# Patient Record
Sex: Male | Born: 1970 | Race: White | Hispanic: No | Smoking: Former smoker
Health system: Southern US, Community
[De-identification: ages and names within clinical notes are randomized; demographics above are authoritative.]

## PROBLEM LIST (undated history)

## (undated) DIAGNOSIS — Z9889 Other specified postprocedural states: Secondary | ICD-10-CM

## (undated) DIAGNOSIS — N200 Calculus of kidney: Secondary | ICD-10-CM

## (undated) DIAGNOSIS — M5136 Other intervertebral disc degeneration, lumbar region: Secondary | ICD-10-CM

## (undated) DIAGNOSIS — M5417 Radiculopathy, lumbosacral region: Secondary | ICD-10-CM

## (undated) DIAGNOSIS — M5410 Radiculopathy, site unspecified: Secondary | ICD-10-CM

## (undated) DIAGNOSIS — I10 Essential (primary) hypertension: Secondary | ICD-10-CM

## (undated) DIAGNOSIS — M545 Low back pain: Secondary | ICD-10-CM

## (undated) HISTORY — PX: CHOLECYSTECTOMY: SHX55

## (undated) HISTORY — DX: Radiculopathy, lumbosacral region: M54.17

## (undated) HISTORY — DX: Low back pain: M54.5

## (undated) HISTORY — DX: Other intervertebral disc degeneration, lumbar region: M51.36

## (undated) HISTORY — DX: Other specified postprocedural states: Z98.890

## (undated) HISTORY — PX: KNEE SURGERY: SHX244

## (undated) HISTORY — PX: ABDOMINAL EXPLORATION SURGERY: SHX538

## (undated) HISTORY — DX: Radiculopathy, site unspecified: M54.10

---

## 2006-04-26 ENCOUNTER — Emergency Department: Payer: Self-pay

## 2007-01-04 ENCOUNTER — Inpatient Hospital Stay: Payer: Self-pay | Admitting: General Surgery

## 2007-01-04 ENCOUNTER — Other Ambulatory Visit: Payer: Self-pay

## 2007-08-11 ENCOUNTER — Encounter: Admission: RE | Admit: 2007-08-11 | Discharge: 2007-08-11 | Payer: Self-pay | Admitting: Urology

## 2009-10-18 ENCOUNTER — Ambulatory Visit: Payer: Self-pay | Admitting: Otolaryngology

## 2011-04-24 ENCOUNTER — Ambulatory Visit: Payer: Self-pay | Admitting: Internal Medicine

## 2011-04-29 ENCOUNTER — Ambulatory Visit: Payer: Self-pay | Admitting: Internal Medicine

## 2011-05-04 ENCOUNTER — Ambulatory Visit: Payer: Self-pay | Admitting: Internal Medicine

## 2011-06-04 ENCOUNTER — Ambulatory Visit: Payer: Self-pay | Admitting: Internal Medicine

## 2011-06-04 ENCOUNTER — Ambulatory Visit: Payer: Self-pay | Admitting: Gastroenterology

## 2012-06-09 ENCOUNTER — Ambulatory Visit: Payer: Self-pay | Admitting: Internal Medicine

## 2012-06-13 ENCOUNTER — Ambulatory Visit: Payer: Self-pay | Admitting: Internal Medicine

## 2012-06-13 LAB — CBC CANCER CENTER
Basophil %: 1 %
Eosinophil #: 0.5 x10 3/mm (ref 0.0–0.7)
Eosinophil %: 4.2 %
HGB: 18 g/dL (ref 13.0–18.0)
Lymphocyte %: 26.5 %
Monocyte %: 8 %
Neutrophil %: 60.3 %
RBC: 6.65 10*6/uL — ABNORMAL HIGH (ref 4.40–5.90)
WBC: 12.9 x10 3/mm — ABNORMAL HIGH (ref 3.8–10.6)

## 2012-06-17 LAB — CANCER CENTER HEMATOCRIT: HCT: 56.1 % — ABNORMAL HIGH (ref 40.0–52.0)

## 2012-06-20 LAB — CANCER CENTER HEMATOCRIT: HCT: 54.1 % — ABNORMAL HIGH (ref 40.0–52.0)

## 2012-06-28 LAB — CANCER CENTER HEMATOCRIT: HCT: 49.9 % (ref 40.0–52.0)

## 2012-07-03 ENCOUNTER — Ambulatory Visit: Payer: Self-pay | Admitting: Internal Medicine

## 2012-07-19 LAB — CBC CANCER CENTER
Basophil %: 1.5 %
Eosinophil #: 0.4 x10 3/mm (ref 0.0–0.7)
Lymphocyte #: 3.6 x10 3/mm (ref 1.0–3.6)
MCH: 25.6 pg — ABNORMAL LOW (ref 26.0–34.0)
MCHC: 31.6 g/dL — ABNORMAL LOW (ref 32.0–36.0)
Monocyte #: 1.5 x10 3/mm — ABNORMAL HIGH (ref 0.2–1.0)
Neutrophil #: 10.4 x10 3/mm — ABNORMAL HIGH (ref 1.4–6.5)
RBC: 6.13 10*6/uL — ABNORMAL HIGH (ref 4.40–5.90)
RDW: 15.1 % — ABNORMAL HIGH (ref 11.5–14.5)
WBC: 16.2 x10 3/mm — ABNORMAL HIGH (ref 3.8–10.6)

## 2012-07-22 ENCOUNTER — Ambulatory Visit: Payer: Self-pay | Admitting: Internal Medicine

## 2012-08-02 LAB — CANCER CENTER HEMATOCRIT: HCT: 47.5 % (ref 40.0–52.0)

## 2012-08-03 ENCOUNTER — Ambulatory Visit: Payer: Self-pay | Admitting: Internal Medicine

## 2012-08-23 LAB — CBC CANCER CENTER
Basophil #: 0.1 x10 3/mm (ref 0.0–0.1)
Basophil %: 1.1 %
Eosinophil %: 1.6 %
HGB: 14.8 g/dL (ref 13.0–18.0)
MCV: 78 fL — ABNORMAL LOW (ref 80–100)
Neutrophil #: 8.2 x10 3/mm — ABNORMAL HIGH (ref 1.4–6.5)
Neutrophil %: 68 %
Platelet: 256 x10 3/mm (ref 150–440)
RBC: 6.09 10*6/uL — ABNORMAL HIGH (ref 4.40–5.90)

## 2012-09-03 ENCOUNTER — Ambulatory Visit: Payer: Self-pay | Admitting: Internal Medicine

## 2012-09-13 LAB — CANCER CENTER HEMATOCRIT: HCT: 50.2 % (ref 40.0–52.0)

## 2012-10-01 ENCOUNTER — Ambulatory Visit: Payer: Self-pay | Admitting: Internal Medicine

## 2012-10-11 LAB — OCCULT BLOOD X 1 CARD TO LAB, STOOL: Occult Blood, Feces: NEGATIVE

## 2012-10-20 ENCOUNTER — Emergency Department: Payer: Self-pay | Admitting: Emergency Medicine

## 2012-10-20 LAB — BASIC METABOLIC PANEL
BUN: 10 mg/dL (ref 7–18)
Calcium, Total: 8.7 mg/dL (ref 8.5–10.1)
Chloride: 100 mmol/L (ref 98–107)
Creatinine: 0.64 mg/dL (ref 0.60–1.30)
EGFR (African American): >60
Glucose: 79 mg/dL (ref 65–99)
Osmolality: 272 (ref 275–301)

## 2012-10-20 LAB — CBC
HGB: 15.8 g/dL (ref 13.0–18.0)
MCH: 23.9 pg — ABNORMAL LOW (ref 26.0–34.0)

## 2012-10-20 LAB — URINALYSIS, COMPLETE
Bilirubin,UR: NEGATIVE
Glucose,UR: NEGATIVE mg/dL (ref 0–75)
Nitrite: NEGATIVE
Ph: 5 (ref 4.5–8.0)
Specific Gravity: 1.026 (ref 1.003–1.030)
WBC UR: 70 /HPF (ref 0–5)

## 2012-11-01 ENCOUNTER — Ambulatory Visit: Payer: Self-pay | Admitting: Internal Medicine

## 2012-12-01 ENCOUNTER — Ambulatory Visit: Payer: Self-pay | Admitting: Internal Medicine

## 2014-02-25 ENCOUNTER — Emergency Department: Payer: Self-pay | Admitting: Internal Medicine

## 2014-02-25 LAB — URINALYSIS, COMPLETE
Bilirubin,UR: NEGATIVE
Blood: NEGATIVE
GLUCOSE, UR: NEGATIVE mg/dL (ref 0–75)
KETONE: NEGATIVE
LEUKOCYTE ESTERASE: NEGATIVE
Nitrite: NEGATIVE
Ph: 7 (ref 4.5–8.0)
Protein: 30
RBC,UR: 1 /HPF (ref 0–5)
SPECIFIC GRAVITY: 1.028 (ref 1.003–1.030)
Squamous Epithelial: 2
WBC UR: 7 /HPF (ref 0–5)

## 2014-02-25 LAB — BASIC METABOLIC PANEL
Anion Gap: 9 (ref 7–16)
BUN: 18 mg/dL (ref 7–18)
Calcium, Total: 8.4 mg/dL — ABNORMAL LOW (ref 8.5–10.1)
Chloride: 100 mmol/L (ref 98–107)
Co2: 30 mmol/L (ref 21–32)
Creatinine: 0.78 mg/dL (ref 0.60–1.30)
EGFR (African American): >60
Glucose: 93 mg/dL (ref 65–99)
OSMOLALITY: 279 (ref 275–301)
Potassium: 3.4 mmol/L — ABNORMAL LOW (ref 3.5–5.1)
Sodium: 139 mmol/L (ref 136–145)

## 2014-02-25 LAB — CBC
HCT: 48.1 % (ref 40.0–52.0)
HGB: 16.1 g/dL (ref 13.0–18.0)
MCH: 29.1 pg (ref 26.0–34.0)
MCHC: 33.4 g/dL (ref 32.0–36.0)
MCV: 87 fL (ref 80–100)
Platelet: 271 10*3/uL (ref 150–440)
RBC: 5.52 10*6/uL (ref 4.40–5.90)
RDW: 13.3 % (ref 11.5–14.5)
WBC: 15.4 10*3/uL — ABNORMAL HIGH (ref 3.8–10.6)

## 2014-02-25 LAB — TROPONIN I: Troponin-I: <0.02 ng/mL

## 2014-05-18 ENCOUNTER — Emergency Department: Payer: Self-pay | Admitting: Emergency Medicine

## 2014-05-18 LAB — URINALYSIS, COMPLETE
Bacteria: NONE SEEN
Bilirubin,UR: NEGATIVE
GLUCOSE, UR: NEGATIVE mg/dL (ref 0–75)
Ketone: NEGATIVE
Leukocyte Esterase: NEGATIVE
NITRITE: NEGATIVE
PH: 6 (ref 4.5–8.0)
Protein: NEGATIVE
Specific Gravity: 1.019 (ref 1.003–1.030)
WBC UR: 5 /HPF (ref 0–5)

## 2014-05-18 LAB — COMPREHENSIVE METABOLIC PANEL
ALBUMIN: 3.7 g/dL (ref 3.4–5.0)
ALK PHOS: 53 U/L
ALT: 31 U/L
AST: 14 U/L — AB (ref 15–37)
Anion Gap: 9 (ref 7–16)
BUN: 10 mg/dL (ref 7–18)
Bilirubin,Total: 0.3 mg/dL (ref 0.2–1.0)
CHLORIDE: 99 mmol/L (ref 98–107)
CO2: 32 mmol/L (ref 21–32)
CREATININE: 0.74 mg/dL (ref 0.60–1.30)
Calcium, Total: 8.9 mg/dL (ref 8.5–10.1)
Glucose: 85 mg/dL (ref 65–99)
OSMOLALITY: 278 (ref 275–301)
POTASSIUM: 3.4 mmol/L — AB (ref 3.5–5.1)
SODIUM: 140 mmol/L (ref 136–145)
Total Protein: 7.9 g/dL (ref 6.4–8.2)

## 2014-05-18 LAB — CBC
HCT: 49.6 % (ref 40.0–52.0)
HGB: 16.1 g/dL (ref 13.0–18.0)
MCH: 28.7 pg (ref 26.0–34.0)
MCHC: 32.5 g/dL (ref 32.0–36.0)
MCV: 89 fL (ref 80–100)
Platelet: 307 10*3/uL (ref 150–440)
RBC: 5.6 10*6/uL (ref 4.40–5.90)
RDW: 13.2 % (ref 11.5–14.5)
WBC: 17.8 10*3/uL — ABNORMAL HIGH (ref 3.8–10.6)

## 2014-05-23 ENCOUNTER — Emergency Department: Payer: Self-pay | Admitting: Emergency Medicine

## 2014-05-23 LAB — CBC
HCT: 52.7 % — AB (ref 40.0–52.0)
HGB: 16.8 g/dL (ref 13.0–18.0)
MCH: 28.5 pg (ref 26.0–34.0)
MCHC: 32 g/dL (ref 32.0–36.0)
MCV: 89 fL (ref 80–100)
Platelet: 308 10*3/uL (ref 150–440)
RBC: 5.9 10*6/uL (ref 4.40–5.90)
RDW: 13.5 % (ref 11.5–14.5)
WBC: 9.5 10*3/uL (ref 3.8–10.6)

## 2014-05-23 LAB — COMPREHENSIVE METABOLIC PANEL
ALK PHOS: 57 U/L
AST: 18 U/L (ref 15–37)
Albumin: 3.8 g/dL (ref 3.4–5.0)
Anion Gap: 7 (ref 7–16)
BUN: 13 mg/dL (ref 7–18)
Bilirubin,Total: 0.3 mg/dL (ref 0.2–1.0)
CALCIUM: 8.9 mg/dL (ref 8.5–10.1)
Chloride: 101 mmol/L (ref 98–107)
Co2: 33 mmol/L — ABNORMAL HIGH (ref 21–32)
Creatinine: 0.74 mg/dL (ref 0.60–1.30)
GLUCOSE: 80 mg/dL (ref 65–99)
Osmolality: 280 (ref 275–301)
Potassium: 3.6 mmol/L (ref 3.5–5.1)
SGPT (ALT): 27 U/L
SODIUM: 141 mmol/L (ref 136–145)
TOTAL PROTEIN: 8.4 g/dL — AB (ref 6.4–8.2)

## 2014-05-23 LAB — URINALYSIS, COMPLETE
BACTERIA: NONE SEEN
BILIRUBIN, UR: NEGATIVE
GLUCOSE, UR: NEGATIVE mg/dL (ref 0–75)
Ketone: NEGATIVE
Nitrite: NEGATIVE
Ph: 6 (ref 4.5–8.0)
RBC,UR: 2146 /HPF (ref 0–5)
SPECIFIC GRAVITY: 1.027 (ref 1.003–1.030)
Squamous Epithelial: 5

## 2014-05-31 ENCOUNTER — Emergency Department: Payer: Self-pay | Admitting: Emergency Medicine

## 2014-05-31 LAB — URINALYSIS, COMPLETE
BILIRUBIN, UR: NEGATIVE
Bacteria: NONE SEEN
Blood: NEGATIVE
GLUCOSE, UR: NEGATIVE mg/dL (ref 0–75)
Ketone: NEGATIVE
Nitrite: NEGATIVE
Ph: 6 (ref 4.5–8.0)
RBC,UR: 1 /HPF (ref 0–5)
Specific Gravity: 1.024 (ref 1.003–1.030)
WBC UR: 23 /HPF (ref 0–5)

## 2014-06-11 ENCOUNTER — Ambulatory Visit: Payer: Self-pay | Admitting: Urology

## 2014-10-27 ENCOUNTER — Emergency Department: Payer: Self-pay | Admitting: Emergency Medicine

## 2014-11-07 ENCOUNTER — Ambulatory Visit
Admit: 2014-11-07 | Disposition: A | Discharge status: Home / Self Care | Payer: Self-pay | Attending: Orthopedic Surgery | Admitting: Orthopedic Surgery

## 2015-04-02 ENCOUNTER — Emergency Department: Payer: BLUE CROSS/BLUE SHIELD

## 2015-04-02 ENCOUNTER — Emergency Department
Admission: EM | Admit: 2015-04-02 | Discharge: 2015-04-02 | Disposition: A | Discharge status: Home / Self Care | Payer: BLUE CROSS/BLUE SHIELD | Attending: Emergency Medicine | Admitting: Emergency Medicine

## 2015-04-02 ENCOUNTER — Encounter: Payer: Self-pay | Admitting: Emergency Medicine

## 2015-04-02 DIAGNOSIS — Z87891 Personal history of nicotine dependence: Secondary | ICD-10-CM | POA: Diagnosis not present

## 2015-04-02 DIAGNOSIS — M549 Dorsalgia, unspecified: Secondary | ICD-10-CM | POA: Diagnosis not present

## 2015-04-02 DIAGNOSIS — R109 Unspecified abdominal pain: Secondary | ICD-10-CM | POA: Insufficient documentation

## 2015-04-02 DIAGNOSIS — R110 Nausea: Secondary | ICD-10-CM | POA: Insufficient documentation

## 2015-04-02 HISTORY — DX: Calculus of kidney: N20.0

## 2015-04-02 LAB — URINALYSIS COMPLETE WITH MICROSCOPIC (ARMC ONLY)
BILIRUBIN URINE: NEGATIVE
Bacteria, UA: NONE SEEN
GLUCOSE, UA: NEGATIVE mg/dL
Hgb urine dipstick: NEGATIVE
KETONES UR: NEGATIVE mg/dL
NITRITE: NEGATIVE
Protein, ur: NEGATIVE mg/dL
SPECIFIC GRAVITY, URINE: 1.023 (ref 1.005–1.030)
pH: 6 (ref 5.0–8.0)

## 2015-04-02 LAB — BASIC METABOLIC PANEL
ANION GAP: 10 (ref 5–15)
BUN: 17 mg/dL (ref 6–20)
CALCIUM: 9 mg/dL (ref 8.9–10.3)
CO2: 26 mmol/L (ref 22–32)
Chloride: 103 mmol/L (ref 101–111)
Creatinine, Ser: 0.84 mg/dL (ref 0.61–1.24)
GLUCOSE: 99 mg/dL (ref 65–99)
Potassium: 3.7 mmol/L (ref 3.5–5.1)
Sodium: 139 mmol/L (ref 135–145)

## 2015-04-02 LAB — CBC
HCT: 49.9 % (ref 40.0–52.0)
HEMOGLOBIN: 16.4 g/dL (ref 13.0–18.0)
MCH: 28.4 pg (ref 26.0–34.0)
MCHC: 32.8 g/dL (ref 32.0–36.0)
MCV: 86.6 fL (ref 80.0–100.0)
Platelets: 256 10*3/uL (ref 150–440)
RBC: 5.76 MIL/uL (ref 4.40–5.90)
RDW: 13.5 % (ref 11.5–14.5)
WBC: 16.1 10*3/uL — ABNORMAL HIGH (ref 3.8–10.6)

## 2015-04-02 MED ORDER — MORPHINE SULFATE (PF) 2 MG/ML IV SOLN
2.0000 mg | Freq: Once | INTRAVENOUS | Status: AC
Start: 2015-04-02 — End: 2015-04-02
  Administered 2015-04-02: 2 mg via INTRAVENOUS

## 2015-04-02 MED ORDER — OXYCODONE-ACETAMINOPHEN 5-325 MG PO TABS
ORAL_TABLET | ORAL | Status: AC
  Filled 2015-04-02: qty 1

## 2015-04-02 MED ORDER — ONDANSETRON HCL 4 MG/2ML IJ SOLN
INTRAMUSCULAR | Status: AC
  Administered 2015-04-02: 4 mg via INTRAVENOUS
  Filled 2015-04-02: qty 2

## 2015-04-02 MED ORDER — MORPHINE SULFATE (PF) 2 MG/ML IV SOLN
INTRAVENOUS | Status: AC
  Administered 2015-04-02: 2 mg via INTRAVENOUS
  Filled 2015-04-02: qty 1

## 2015-04-02 MED ORDER — OXYCODONE-ACETAMINOPHEN 5-325 MG PO TABS
1.0000 | ORAL_TABLET | Freq: Once | ORAL | Status: AC
  Administered 2015-04-02: 1 via ORAL

## 2015-04-02 MED ORDER — ONDANSETRON HCL 4 MG/2ML IJ SOLN
4.0000 mg | Freq: Once | INTRAMUSCULAR | Status: AC
  Administered 2015-04-02: 4 mg via INTRAVENOUS

## 2015-04-02 MED ORDER — SODIUM CHLORIDE 0.9 % IV BOLUS (SEPSIS)
1000.0000 mL | Freq: Once | INTRAVENOUS | Status: AC
  Administered 2015-04-02: 1000 mL via INTRAVENOUS

## 2015-04-02 MED ORDER — KETOROLAC TROMETHAMINE 30 MG/ML IJ SOLN
30.0000 mg | Freq: Once | INTRAMUSCULAR | Status: AC
  Administered 2015-04-02: 30 mg via INTRAVENOUS
  Filled 2015-04-02: qty 1

## 2015-04-02 MED ORDER — OXYCODONE-ACETAMINOPHEN 5-325 MG PO TABS: 1.0000 | ORAL_TABLET | ORAL | Status: DC | PRN

## 2015-04-02 NOTE — Discharge Instructions (Signed)
Flank Pain °Flank pain refers to pain that is located on the side of the body between the upper abdomen and the back. The pain may occur over a short period of time (acute) or may be long-term or reoccurring (chronic). It may be mild or severe. Flank pain can be caused by many things. °CAUSES  °Some of the more common causes of flank pain include: °· Muscle strains.   °· Muscle spasms.   °· A disease of your spine (vertebral disk disease).   °· A lung infection (pneumonia).   °· Fluid around your lungs (pulmonary edema).   °· A kidney infection.   °· Kidney stones.   °· A very painful skin rash caused by the chickenpox virus (shingles).   °· Gallbladder disease.   °HOME CARE INSTRUCTIONS  °Home care will depend on the cause of your pain. In general, °· Rest as directed by your caregiver. °· Drink enough fluids to keep your urine clear or pale yellow. °· Only take over-the-counter or prescription medicines as directed by your caregiver. Some medicines may help relieve the pain. °· Tell your caregiver about any changes in your pain. °· Follow up with your caregiver as directed. °SEEK IMMEDIATE MEDICAL CARE IF:  °· Your pain is not controlled with medicine.   °· You have new or worsening symptoms. °· Your pain increases.   °· You have abdominal pain.   °· You have shortness of breath.   °· You have persistent nausea or vomiting.   °· You have swelling in your abdomen.   °· You feel faint or pass out.   °· You have blood in your urine. °· You have a fever or persistent symptoms for more than 2-3 days. °· You have a fever and your symptoms suddenly get worse. °MAKE SURE YOU:  °· Understand these instructions. °· Will watch your condition. °· Will get help right away if you are not doing well or get worse. °Document Released: 09/10/2005 Document Revised: 04/13/2012 Document Reviewed: 03/03/2012 °[employer]® Patient Information ©2015 [employer]. This information is not intended to replace advice given to you by your  health care provider. Make sure you discuss any questions you have with your health care provider. ° °

## 2015-04-02 NOTE — ED Notes (Signed)
Pt reports pain 7/10; 2nd percocet admin per pain protocol

## 2015-04-02 NOTE — ED Provider Notes (Signed)
[facility] Emergency Department Provider Note  ____________________________________________  Time seen: 4:15 AM  I have reviewed the triage vital signs and the nursing notes.   HISTORY  Chief Complaint Flank Pain     HPI Gerald Harris is a 44 y.o. male presents with right flank pain times 2 days. Patient states that the pain radiates to his groin/testicle. Patient denies any fever however does admit to nausea. Of note patient has a history of "approximately 60 kidney stones in the past". Patient denies any fever no dysuria no hematuria. She states he has never required lithotripsy or ureteral stent for his kidney stones in the past.     Past Medical History  Diagnosis Date  . Kidney stones     There are no active problems to display for this patient.   Past Surgical History  Procedure Laterality Date  . Cholecystectomy    . Knee surgery Right     No current outpatient prescriptions on file.  Allergies Ciprofloxacin  No family history on file.  Social History Social History  Substance Use Topics  . Smoking status: Former Research scientist (life sciences)  . Smokeless tobacco: None  . Alcohol Use: No    Review of Systems  Constitutional: Negative for fever. Eyes: Negative for visual changes. ENT: Negative for sore throat. Cardiovascular: Negative for chest pain. Respiratory: Negative for shortness of breath. Gastrointestinal: Negative for abdominal pain, vomiting and diarrhea. Genitourinary: Negative for dysuria. Musculoskeletal: Positive for back pain. Skin: Negative for rash. Neurological: Negative for headaches, focal weakness or numbness.   10-point ROS otherwise negative.  ____________________________________________   PHYSICAL EXAM:  VITAL SIGNS: ED Triage Vitals  Enc Vitals Group     BP 04/02/15 0033 111/65 mmHg     Pulse Rate 04/02/15 0033 82     Resp 04/02/15 0033 18     Temp 04/02/15 0033 98.2 F (36.8 C)     Temp Source 04/02/15  0033 Oral     SpO2 04/02/15 0033 98 %     Weight 04/02/15 0033 283 lb (128.368 kg)     Height 04/02/15 0033 5\' 11"  (1.803 m)     Head Cir --      Peak Flow --      Pain Score 04/02/15 0040 8     Pain Loc --      Pain Edu? --      Excl. in [place]? --      Constitutional: Alert and oriented. Well appearing and in no distress. Eyes: Conjunctivae are normal. PERRL. Normal extraocular movements. ENT   Head: Normocephalic and atraumatic.   Nose: No congestion/rhinnorhea.   Mouth/Throat: Mucous membranes are moist.   Neck: No stridor.. Cardiovascular: Normal rate, regular rhythm. Normal and symmetric distal pulses are present in all extremities. No murmurs, rubs, or gallops. Respiratory: Normal respiratory effort without tachypnea nor retractions. Breath sounds are clear and equal bilaterally. No wheezes/rales/rhonchi. Gastrointestinal: Soft and nontender. No distention. There is no CVA tenderness. Genitourinary: deferred Musculoskeletal: Nontender with normal range of motion in all extremities. No joint effusions.  No lower extremity tenderness nor edema. Neurologic:  Normal speech and language. No gross focal neurologic deficits are appreciated. Speech is normal.  Skin:  Skin is warm, dry and intact. No rash noted. Psychiatric: Mood and affect are normal. Speech and behavior are normal. Patient exhibits appropriate insight and judgment.  ____________________________________________    LABS (pertinent positives/negatives)  Labs Reviewed  CBC - Abnormal; Notable for the following:    WBC 16.1 (*)  All other components within normal limits  URINALYSIS COMPLETEWITH MICROSCOPIC ([facility] ONLY) - Abnormal; Notable for the following:    Color, Urine YELLOW (*)    APPearance CLEAR (*)    Leukocytes, UA TRACE (*)    Squamous Epithelial / LPF 0-5 (*)    All other components within normal limits  BASIC METABOLIC PANEL     RADIOLOGY     CT RENAL STONE STUDY (Final result)  Result time: 04/02/15 07:00:50   Final result by Rad Results In Interface (04/02/15 07:00:50)   Narrative:   CLINICAL DATA: Right flank pain, onset last week but now worsening.  EXAM: CT ABDOMEN AND PELVIS WITHOUT CONTRAST  TECHNIQUE: Multidetector CT imaging of the abdomen and pelvis was performed following the standard protocol without IV contrast.  COMPARISON: 06/11/2014  FINDINGS: No urinary calculi are evident. There is no hydronephrosis. There is no ureteral dilatation.  There is cholecystectomy. There are normal unenhanced appearances of the liver and bile ducts. There are normal unenhanced appearances of the spleen, pancreas and adrenals.  There are normal appearances of the stomach, small bowel and colon. The appendix is normal.  The abdominal aorta is normal in caliber. There is no atherosclerotic calcification. There is no adenopathy in the abdomen or pelvis.  No acute inflammatory changes are evident in the abdomen or pelvis. There is no ascites. There is no significant abnormality in the lower chest. There is no significant musculoskeletal abnormality. 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  IMPRESSION: No significant abnormality.   Electronically Signed By: Andreas Newport M.D. On: 04/02/2015 07:00          DG Abd 1 View (Final result) Result time: 04/02/15 05:50:51   Final result by Rad Results In Interface (04/02/15 05:50:51)   Narrative:   CLINICAL DATA: RIGHT flank pain beginning last week, now worsening, radiating to the RIGHT groin. History of kidney stones.  EXAM: ABDOMEN - 1 VIEW  COMPARISON: CT abdomen and pelvis June 11, 2014  FINDINGS: The bowel gas pattern is normal. No radio-opaque urolithiasis or other significant radiographic abnormality are seen. Symmetric phleboliths in the pelvis present previously. Surgical clips in the included right abdomen compatible with  cholecystectomy.  IMPRESSION: No radiopaque urolithiasis though CT would be more sensitive.  Normal bowel gas pattern.   Electronically Signed By: Elon Alas M.D. On: 04/02/2015 05:50            INITIAL IMPRESSION / ASSESSMENT AND PLAN / ED COURSE  Pertinent labs & imaging results that were available during my care of the patient were reviewed by me and considered in my medical decision making (see chart for details). Patient received IV morphine and Zofran for analgesia and antiemetic respectively. Pain improved Patient states he said symptoms consistent with this in the past which was a kidney stone that was not visualized on x-ray or CAT scan  ____________________________________________   FINAL CLINICAL IMPRESSION(S) / ED DIAGNOSES  Final diagnoses:  Right flank pain      Gregor Hams, MD 04/03/15 [phone]

## 2015-04-02 NOTE — ED Notes (Signed)
Patient ambulatory to triage with steady gait, without difficulty or distress noted; pt reports right flank pain since last week, now with increasing pain radiating into groin; denies urinary c/o pain; reports hx frequent kidney stones

## 2015-05-30 ENCOUNTER — Encounter: Payer: Self-pay | Admitting: Radiology

## 2015-05-30 ENCOUNTER — Emergency Department: Payer: BLUE CROSS/BLUE SHIELD

## 2015-05-30 ENCOUNTER — Emergency Department
Admission: EM | Admit: 2015-05-30 | Discharge: 2015-05-30 | Disposition: A | Discharge status: Home / Self Care | Payer: BLUE CROSS/BLUE SHIELD | Attending: Emergency Medicine | Admitting: Emergency Medicine

## 2015-05-30 DIAGNOSIS — M545 Low back pain, unspecified: Secondary | ICD-10-CM

## 2015-05-30 DIAGNOSIS — Z87891 Personal history of nicotine dependence: Secondary | ICD-10-CM | POA: Diagnosis not present

## 2015-05-30 DIAGNOSIS — Z79899 Other long term (current) drug therapy: Secondary | ICD-10-CM | POA: Insufficient documentation

## 2015-05-30 DIAGNOSIS — I10 Essential (primary) hypertension: Secondary | ICD-10-CM | POA: Insufficient documentation

## 2015-05-30 HISTORY — DX: Essential (primary) hypertension: I10

## 2015-05-30 LAB — COMPREHENSIVE METABOLIC PANEL
ALK PHOS: 56 U/L (ref 38–126)
ALT: 25 U/L (ref 17–63)
AST: 21 U/L (ref 15–41)
Albumin: 4 g/dL (ref 3.5–5.0)
Anion gap: 9 (ref 5–15)
BUN: 10 mg/dL (ref 6–20)
CALCIUM: 9 mg/dL (ref 8.9–10.3)
CO2: 27 mmol/L (ref 22–32)
CREATININE: 0.6 mg/dL — AB (ref 0.61–1.24)
Chloride: 102 mmol/L (ref 101–111)
GFR calc non Af Amer: >60 mL/min (ref >60)
Glucose, Bld: 99 mg/dL (ref 65–99)
Potassium: 3.9 mmol/L (ref 3.5–5.1)
SODIUM: 138 mmol/L (ref 135–145)
Total Bilirubin: 0.6 mg/dL (ref 0.3–1.2)
Total Protein: 7.8 g/dL (ref 6.5–8.1)

## 2015-05-30 LAB — URINALYSIS COMPLETE WITH MICROSCOPIC (ARMC ONLY)
BACTERIA UA: NONE SEEN
Bilirubin Urine: NEGATIVE
GLUCOSE, UA: NEGATIVE mg/dL
Hgb urine dipstick: NEGATIVE
Ketones, ur: NEGATIVE mg/dL
Leukocytes, UA: NEGATIVE
NITRITE: NEGATIVE
Protein, ur: NEGATIVE mg/dL
SPECIFIC GRAVITY, URINE: 1.024 (ref 1.005–1.030)
pH: 5 (ref 5.0–8.0)

## 2015-05-30 LAB — CBC
HCT: 46.1 % (ref 40.0–52.0)
HEMOGLOBIN: 15.6 g/dL (ref 13.0–18.0)
MCH: 28.5 pg (ref 26.0–34.0)
MCHC: 33.8 g/dL (ref 32.0–36.0)
MCV: 84.3 fL (ref 80.0–100.0)
Platelets: 241 10*3/uL (ref 150–440)
RBC: 5.47 MIL/uL (ref 4.40–5.90)
RDW: 13.9 % (ref 11.5–14.5)
WBC: 11.2 10*3/uL — ABNORMAL HIGH (ref 3.8–10.6)

## 2015-05-30 MED ORDER — OXYCODONE HCL 5 MG PO TABS: 5.0000 mg | ORAL_TABLET | Freq: Four times a day (QID) | ORAL | Status: DC | PRN

## 2015-05-30 MED ORDER — PREDNISONE 20 MG PO TABS: 40.0000 mg | ORAL_TABLET | Freq: Every day | ORAL | Status: DC

## 2015-05-30 MED ORDER — OXYCODONE HCL 5 MG PO TABS
10.0000 mg | ORAL_TABLET | ORAL | Status: AC
  Administered 2015-05-30: 10 mg via ORAL
  Filled 2015-05-30: qty 2

## 2015-05-30 MED ORDER — IOHEXOL 350 MG/ML SOLN
125.0000 mL | Freq: Once | INTRAVENOUS | Status: AC | PRN
  Administered 2015-05-30: 125 mL via INTRAVENOUS
  Filled 2015-05-30: qty 125

## 2015-05-30 MED ORDER — KETOROLAC TROMETHAMINE 30 MG/ML IJ SOLN
30.0000 mg | Freq: Once | INTRAMUSCULAR | Status: AC
  Administered 2015-05-30: 30 mg via INTRAVENOUS
  Filled 2015-05-30: qty 1

## 2015-05-30 NOTE — ED Notes (Signed)
Pt c/o pain across mid back intermittently since Sunday, worsening yesterday. Pt denies injuries, denies urinary sx's. Pt reports does have hx of kidney stones but states he has never had the pain on both sides.

## 2015-05-30 NOTE — Discharge Instructions (Signed)

## 2015-05-30 NOTE — ED Notes (Signed)
Bilateral flank pain since Sunday, hx of kidney stones ( over 40 stones) "this is different from normal.

## 2015-05-30 NOTE — ED Provider Notes (Signed)
[facility] Emergency Department Provider Note REMINDER - THIS NOTE IS NOT A FINAL MEDICAL RECORD UNTIL IT IS SIGNED. UNTIL THEN, THE CONTENT BELOW MAY REFLECT INFORMATION FROM A DOCUMENTATION TEMPLATE, NOT THE ACTUAL PATIENT VISIT. ____________________________________________  Time seen: Approximately 5:15 PM  I have reviewed the triage vital signs and the nursing notes.   HISTORY  Chief Complaint Back Pain    HPI Gerald Harris is a 44 y.o. male previous history of kidney stones to hypertension.  Patientstates that having pain in his upper portion of the low back that radiates out towards both kidneys. Reports the pain is severe, sharp and worsened with standing and walking or laying flat. No associated fevers or chills. No chest pain, no trouble breathing.  No associated numbness or tingling, no trouble with his bowels or bladder. No weakness in the legs. No recent infection. Patient thinks it feels like pain from kidney stones, but at the same time it feels more like it's the center of his upper lumbar back. It does radiate down slightly across both flanks but not into the groin or buttock.   Past Medical History  Diagnosis Date  . Kidney stones   . Hypertension     There are no active problems to display for this patient.   Past Surgical History  Procedure Laterality Date  . Cholecystectomy    . Knee surgery Right     Current Outpatient Rx  Name  Route  Sig  Dispense  Refill  . hydrochlorothiazide (HYDRODIURIL) 25 MG tablet   Oral   Take 25 mg by mouth daily.         Marland Kitchen LISINOPRIL PO   Oral   Take 1 tablet by mouth daily.          Marland Kitchen oxyCODONE (OXY IR/ROXICODONE) 5 MG immediate release tablet   Oral   Take 1 tablet (5 mg total) by mouth every 6 (six) hours as needed for severe pain.   30 tablet   0   . oxyCODONE-acetaminophen (PERCOCET/ROXICET) 5-325 MG per tablet   Oral   Take 1 tablet by mouth every 4 (four) hours as needed  for severe pain.   15 tablet   0   . PARoxetine HCl (PAXIL PO)   Oral   Take 1 tablet by mouth daily.          . predniSONE (DELTASONE) 20 MG tablet   Oral   Take 2 tablets (40 mg total) by mouth daily.   10 tablet   0     Allergies Ciprofloxacin  No family history on file.  Social History Social History  Substance Use Topics  . Smoking status: Former Research scientist (life sciences)  . Smokeless tobacco: None  . Alcohol Use: No    Review of Systems Constitutional: No fever/chills Eyes: No visual changes. ENT: No sore throat. Cardiovascular: Denies chest pain. Respiratory: Denies shortness of breath. Gastrointestinal: No abdominal pain.  No nausea, no vomiting.  No diarrhea.  No constipation. Genitourinary: Negative for dysuria. Musculoskeletal: See history of present illness Skin: Negative for rash. Neurological: Negative for headaches, focal weakness or numbness.  10-point ROS otherwise negative.  ____________________________________________   PHYSICAL EXAM:  VITAL SIGNS: ED Triage Vitals  Enc Vitals Group     BP 05/30/15 1236 121/85 mmHg     Pulse Rate 05/30/15 1236 86     Resp 05/30/15 1236 20     Temp 05/30/15 1236 98.6 F (37 C)     Temp Source 05/30/15  1236 Oral     SpO2 05/30/15 1236 96 %     Weight 05/30/15 1236 302 lb (136.986 kg)     Height 05/30/15 1236 5\' 10"  (1.778 m)     Head Cir --      Peak Flow --      Pain Score 05/30/15 1237 9     Pain Loc --      Pain Edu? --      Excl. in [place]? --    Constitutional: Alert and oriented. Well appearing and in no acute distress. Eyes: Conjunctivae are normal. PERRL. EOMI. Head: Atraumatic. Nose: No congestion/rhinnorhea. Mouth/Throat: Mucous membranes are moist.  Oropharynx non-erythematous. Neck: No stridor.  No cervical spine tenderness. No thoracic tenderness. Patient does have moderate tenderness over the upper lumbar spine proximally region of L1. There is no step-off, erythema. Cardiovascular: Normal rate,  regular rhythm. Grossly normal heart sounds.  Good peripheral circulation. Respiratory: Normal respiratory effort.  No retractions. Lungs CTAB. Gastrointestinal: Soft and nontender. No distention. No abdominal bruits. No CVA tenderness. Musculoskeletal: No lower extremity tenderness nor edema.  No joint effusions. Neurologic:  Normal speech and language. No gross focal neurologic deficits are appreciated. No gait instability. Skin:  Skin is warm, dry and intact. No rash noted. Psychiatric: Mood and affect are normal. Speech and behavior are normal.  ____________________________________________   LABS (all labs ordered are listed, but only abnormal results are displayed)  Labs Reviewed  CBC - Abnormal; Notable for the following:    WBC 11.2 (*)    All other components within normal limits  COMPREHENSIVE METABOLIC PANEL - Abnormal; Notable for the following:    Creatinine, Ser 0.60 (*)    All other components within normal limits  URINALYSIS COMPLETEWITH MICROSCOPIC ([facility] ONLY) - Abnormal; Notable for the following:    Color, Urine YELLOW (*)    APPearance CLEAR (*)    Squamous Epithelial / LPF 0-5 (*)    All other components within normal limits   ____________________________________________  EKG  ED ECG REPORT I, Zaul Hubers, the attending physician, personally viewed and interpreted this ECG.  Date: 05/30/2015 EKG Time: 1530 Rate: 65 Rhythm: normal sinus rhythm QRS Axis: normal Intervals: normal ST/T Wave abnormalities: normal Conduction Disutrbances: none Narrative Interpretation: unremarkable  ____________________________________________  RADIOLOGY  IMPRESSION: No significant visceral artery narrowing to result in back pain.  No acute intra-abdominal pathology. ____________________________________________   PROCEDURES  Procedure(s) performed: None  Critical Care performed: No  ____________________________________________   INITIAL IMPRESSION /  ASSESSMENT AND PLAN / ED COURSE  Pertinent labs & imaging results that were available during my care of the patient were reviewed by me and considered in my medical decision making (see chart for details).  Patient presents with sharp, rather severe upper lumbar back pain radiating down. Does have a history of hypertension, no associated cardiopulmonary symptoms. No abdominal pain. Appears to be primarily isolated back pain, and recent CAT scan did not demonstrate kidney stones. Given his history of hypertension, and atraumatic back pain consideration is made for the all be unlikely but not impossible aortic dissection. We'll obtain CT imaging to exclude, the patient does not have any acute neurologic symptoms as a very reassuring and normal neurologic exam. In addition, referred pain from the abdomen is considered. No obvious symptoms of kidney stone, pancreatitis, cholecystitis, or evidence of intra-abdominal abnormality by exam.  ----------------------------------------- 5:18 PM on 05/30/2015 -----------------------------------------  EKG is normal. Patient reports pain is much improved after oxycodone, ambulatory with normal gait.  I will prescribe the patient a narcotic pain medicine due to their condition which I anticipate will cause at least moderate pain short term. I discussed with the patient safe use of narcotic pain medicines, and that they are not to drive, work in dangerous areas, or ever take more than prescribed (no more than 1 pill every 6 hours). We discussed that this is the type of medication that "Alfonse Spruce" may have overdosed on and the risks of this type of medicine. Patient is very agreeable to only use as prescribed and to never use more than prescribed.  At this point, CT does not demonstrate any acute abnormality labs are reassuring. I suspect is likely having pain referred from the back, possibly slight herniated disc without evidence of neurologic abnormality or impingement.  Did discuss careful return precautions, careful use of oxycodone, and close follow-up care. Patient is very agreeable. ____________________________________________   FINAL CLINICAL IMPRESSION(S) / ED DIAGNOSES  Final diagnoses:  Back pain, lumbosacral      Delman Kitten, MD 05/30/15 1719

## 2015-05-30 NOTE — ED Notes (Signed)
MD at bedside. 

## 2015-06-04 DIAGNOSIS — Z6841 Body Mass Index (BMI) 40.0 and over, adult: Secondary | ICD-10-CM | POA: Insufficient documentation

## 2015-06-10 ENCOUNTER — Other Ambulatory Visit: Payer: Self-pay | Admitting: Internal Medicine

## 2015-06-10 DIAGNOSIS — M5440 Lumbago with sciatica, unspecified side: Secondary | ICD-10-CM

## 2015-06-21 ENCOUNTER — Ambulatory Visit (HOSPITAL_COMMUNITY)
Admission: RE | Admit: 2015-06-21 | Discharge: 2015-06-21 | Disposition: A | Discharge status: Home / Self Care | Payer: BLUE CROSS/BLUE SHIELD | Source: Ambulatory Visit | Attending: Internal Medicine | Admitting: Internal Medicine

## 2015-06-21 DIAGNOSIS — M5440 Lumbago with sciatica, unspecified side: Secondary | ICD-10-CM

## 2015-06-21 MED ORDER — GADOBENATE DIMEGLUMINE 529 MG/ML IV SOLN
20.0000 mL | Freq: Once | INTRAVENOUS | Status: AC | PRN
  Administered 2015-06-21: 20 mL via INTRAVENOUS

## 2015-08-30 ENCOUNTER — Encounter: Payer: Self-pay | Admitting: *Deleted

## 2015-08-30 DIAGNOSIS — I10 Essential (primary) hypertension: Secondary | ICD-10-CM | POA: Diagnosis not present

## 2015-08-30 DIAGNOSIS — Z87891 Personal history of nicotine dependence: Secondary | ICD-10-CM | POA: Insufficient documentation

## 2015-08-30 DIAGNOSIS — Z7952 Long term (current) use of systemic steroids: Secondary | ICD-10-CM | POA: Diagnosis not present

## 2015-08-30 DIAGNOSIS — R109 Unspecified abdominal pain: Secondary | ICD-10-CM | POA: Insufficient documentation

## 2015-08-30 DIAGNOSIS — R319 Hematuria, unspecified: Secondary | ICD-10-CM | POA: Diagnosis not present

## 2015-08-30 DIAGNOSIS — Z79899 Other long term (current) drug therapy: Secondary | ICD-10-CM | POA: Insufficient documentation

## 2015-08-30 DIAGNOSIS — N50811 Right testicular pain: Secondary | ICD-10-CM | POA: Diagnosis not present

## 2015-08-30 LAB — URINALYSIS COMPLETE WITH MICROSCOPIC (ARMC ONLY)
BACTERIA UA: NONE SEEN
Bilirubin Urine: NEGATIVE
GLUCOSE, UA: NEGATIVE mg/dL
Hgb urine dipstick: NEGATIVE
Ketones, ur: NEGATIVE mg/dL
NITRITE: NEGATIVE
PROTEIN: NEGATIVE mg/dL
SPECIFIC GRAVITY, URINE: 1.028 (ref 1.005–1.030)
pH: 5 (ref 5.0–8.0)

## 2015-08-30 LAB — CBC
HEMATOCRIT: 45.7 % (ref 40.0–52.0)
Hemoglobin: 15.2 g/dL (ref 13.0–18.0)
MCH: 28.5 pg (ref 26.0–34.0)
MCHC: 33.3 g/dL (ref 32.0–36.0)
MCV: 85.6 fL (ref 80.0–100.0)
PLATELETS: 227 10*3/uL (ref 150–440)
RBC: 5.34 MIL/uL (ref 4.40–5.90)
RDW: 13.5 % (ref 11.5–14.5)
WBC: 12.3 10*3/uL — ABNORMAL HIGH (ref 3.8–10.6)

## 2015-08-30 LAB — BASIC METABOLIC PANEL
Anion gap: 8 (ref 5–15)
BUN: 16 mg/dL (ref 6–20)
CHLORIDE: 104 mmol/L (ref 101–111)
CO2: 29 mmol/L (ref 22–32)
CREATININE: 0.62 mg/dL (ref 0.61–1.24)
Calcium: 9.1 mg/dL (ref 8.9–10.3)
GFR calc Af Amer: >60 mL/min (ref >60)
GFR calc non Af Amer: >60 mL/min (ref >60)
GLUCOSE: 103 mg/dL — AB (ref 65–99)
POTASSIUM: 3.5 mmol/L (ref 3.5–5.1)
Sodium: 141 mmol/L (ref 135–145)

## 2015-08-30 MED ORDER — SODIUM CHLORIDE 0.9 % IV BOLUS (SEPSIS)
1000.0000 mL | Freq: Once | INTRAVENOUS | Status: AC
  Administered 2015-08-30: 1000 mL via INTRAVENOUS

## 2015-08-30 NOTE — ED Notes (Signed)
Iv in ac per pt preference.

## 2015-08-30 NOTE — ED Notes (Signed)
Pt has right flank pain.  Sx for 2 days.  Pt has nausea.  Pt reports pain in right testicle.

## 2015-08-31 ENCOUNTER — Emergency Department
Admission: EM | Admit: 2015-08-31 | Discharge: 2015-08-31 | Disposition: A | Discharge status: Home / Self Care | Payer: BLUE CROSS/BLUE SHIELD | Attending: Emergency Medicine | Admitting: Emergency Medicine

## 2015-08-31 ENCOUNTER — Emergency Department: Payer: BLUE CROSS/BLUE SHIELD

## 2015-08-31 DIAGNOSIS — R109 Unspecified abdominal pain: Secondary | ICD-10-CM

## 2015-08-31 DIAGNOSIS — N50811 Right testicular pain: Secondary | ICD-10-CM

## 2015-08-31 MED ORDER — KETOROLAC TROMETHAMINE 10 MG PO TABS: 10.0000 mg | ORAL_TABLET | Freq: Once | ORAL | Status: DC

## 2015-08-31 MED ORDER — KETOROLAC TROMETHAMINE 30 MG/ML IJ SOLN
30.0000 mg | Freq: Once | INTRAMUSCULAR | Status: AC
  Administered 2015-08-31: 30 mg via INTRAVENOUS
  Filled 2015-08-31: qty 1

## 2015-08-31 MED ORDER — ONDANSETRON HCL 4 MG/2ML IJ SOLN
4.0000 mg | Freq: Once | INTRAMUSCULAR | Status: AC
  Administered 2015-08-31: 4 mg via INTRAVENOUS
  Filled 2015-08-31: qty 2

## 2015-08-31 MED ORDER — KETOROLAC TROMETHAMINE 10 MG PO TABS: 10.0000 mg | ORAL_TABLET | Freq: Three times a day (TID) | ORAL | Status: DC | PRN

## 2015-08-31 MED ORDER — OXYCODONE-ACETAMINOPHEN 5-325 MG PO TABS
2.0000 | ORAL_TABLET | Freq: Once | ORAL | Status: AC
Start: 2015-08-31 — End: 2015-08-31
  Administered 2015-08-31: 2 via ORAL
  Filled 2015-08-31: qty 2

## 2015-08-31 MED ORDER — MORPHINE SULFATE (PF) 2 MG/ML IV SOLN
2.0000 mg | Freq: Once | INTRAVENOUS | Status: AC
  Administered 2015-08-31: 2 mg via INTRAVENOUS
  Filled 2015-08-31: qty 1

## 2015-08-31 NOTE — ED Provider Notes (Signed)
[facility] Emergency Department Provider Note  ____________________________________________  Time seen: 12:00 AM  I have reviewed the triage vital signs and the nursing notes.   HISTORY  Chief Complaint Flank Pain      HPI Gerald Harris is a 45 y.o. male presents with right flank pain 2 days as currently 7 out of 10 radiating to the right groin. Patient denies any fever however does admit to hematuria patient denies any dysuria. Of note patient has a history of multiple kidney stones in the past followed by [facility] urology.Patient denies any nausea vomiting or diarrhea     Past Medical History  Diagnosis Date  . Kidney stones   . Hypertension     There are no active problems to display for this patient.   Past Surgical History  Procedure Laterality Date  . Cholecystectomy    . Knee surgery Right     Current Outpatient Rx  Name  Route  Sig  Dispense  Refill  . hydrochlorothiazide (HYDRODIURIL) 25 MG tablet   Oral   Take 25 mg by mouth daily.         Marland Kitchen LISINOPRIL PO   Oral   Take 1 tablet by mouth daily.          Marland Kitchen oxyCODONE (OXY IR/ROXICODONE) 5 MG immediate release tablet   Oral   Take 1 tablet (5 mg total) by mouth every 6 (six) hours as needed for severe pain.   30 tablet   0   . oxyCODONE-acetaminophen (PERCOCET/ROXICET) 5-325 MG per tablet   Oral   Take 1 tablet by mouth every 4 (four) hours as needed for severe pain.   15 tablet   0   . PARoxetine HCl (PAXIL PO)   Oral   Take 1 tablet by mouth daily.          . predniSONE (DELTASONE) 20 MG tablet   Oral   Take 2 tablets (40 mg total) by mouth daily.   10 tablet   0     Allergies Ciprofloxacin  No family history on file.  Social History Social History  Substance Use Topics  . Smoking status: Former Research scientist (life sciences)  . Smokeless tobacco: None  . Alcohol Use: No    Review of Systems  Constitutional: Negative for fever. Eyes: Negative for visual  changes. ENT: Negative for sore throat. Cardiovascular: Negative for chest pain. Respiratory: Negative for shortness of breath. Gastrointestinal: Negative for abdominal pain, vomiting and diarrhea. Genitourinary: Negative for dysuria. Positive for left flank pain Musculoskeletal: Negative for back pain. Skin: Negative for rash. Neurological: Negative for headaches, focal weakness or numbness.   10-point ROS otherwise negative.  ____________________________________________   PHYSICAL EXAM:  VITAL SIGNS: ED Triage Vitals  Enc Vitals Group     BP 08/30/15 2239 126/68 mmHg     Pulse Rate 08/30/15 2239 77     Resp 08/30/15 2239 20     Temp 08/30/15 2239 97.9 F (36.6 C)     Temp Source 08/30/15 2239 Oral     SpO2 08/30/15 2239 99 %     Weight 08/30/15 2239 289 lb (131.09 kg)     Height 08/30/15 2239 5\' 11"  (1.803 m)     Head Cir --      Peak Flow --      Pain Score 08/30/15 2240 8     Pain Loc --      Pain Edu? --      Excl. in [place]? --  Constitutional: Alert and oriented. Well appearing and in no distress. Eyes: Conjunctivae are normal. PERRL. Normal extraocular movements. ENT   Head: Normocephalic and atraumatic.   Nose: No congestion/rhinnorhea.   Mouth/Throat: Mucous membranes are moist.   Neck: No stridor. Hematological/Lymphatic/Immunilogical: No cervical lymphadenopathy. Cardiovascular: Normal rate, regular rhythm. Normal and symmetric distal pulses are present in all extremities. No murmurs, rubs, or gallops. Respiratory: Normal respiratory effort without tachypnea nor retractions. Breath sounds are clear and equal bilaterally. No wheezes/rales/rhonchi. Gastrointestinal: Soft and nontender. No distention. There is no CVA tenderness. Genitourinary: deferred Musculoskeletal: Nontender with normal range of motion in all extremities. No joint effusions.  No lower extremity tenderness nor edema. Neurologic:  Normal speech and language. No gross focal  neurologic deficits are appreciated. Speech is normal.  Skin:  Skin is warm, dry and intact. No rash noted. Psychiatric: Mood and affect are normal. Speech and behavior are normal. Patient exhibits appropriate insight and judgment.  ____________________________________________    LABS (pertinent positives/negatives)  Labs Reviewed  BASIC METABOLIC PANEL - Abnormal; Notable for the following:    Glucose, Bld 103 (*)    All other components within normal limits  CBC - Abnormal; Notable for the following:    WBC 12.3 (*)    All other components within normal limits  URINALYSIS COMPLETEWITH MICROSCOPIC ([facility] ONLY) - Abnormal; Notable for the following:    Color, Urine YELLOW (*)    APPearance HAZY (*)    Leukocytes, UA TRACE (*)    Squamous Epithelial / LPF 0-5 (*)    All other components within normal limits      RADIOLOGY  CT RENAL STONE STUDY (Final result) Result time: 08/31/15 01:33:06   Final result by Rad Results In Interface (08/31/15 01:33:06)   Narrative:   CLINICAL DATA: Initial evaluation for acute right flank pain.  EXAM: CT ABDOMEN AND PELVIS WITHOUT CONTRAST  TECHNIQUE: Multidetector CT imaging of the abdomen and pelvis was performed following the standard protocol without IV contrast.  COMPARISON: Prior study from 05/30/2015.  FINDINGS: Minimal subsegmental atelectasis present within the visualized lung bases. The visualized lungs are otherwise clear.  Liver demonstrates a normal unenhanced appearance. Gallbladder surgically absent. No biliary dilatation. Spleen, adrenal glands, and pancreas demonstrate a normal unenhanced appearance.  Kidneys equal in size without evidence of nephrolithiasis or hydronephrosis. No radiopaque calculi seen along the course of either renal collecting system. There is no hydroureter. Possible tiny hypodense cystic lesion at the upper pole of the right kidney, not well evaluated on this exam.  Stomach within normal  limits. No evidence for bowel obstruction. Appendix well visualized in the right lower quadrant and is of normal caliber and appearance without associated inflammatory changes to suggest acute appendicitis. No abnormal wall thickening or inflammatory stranding seen elsewhere about the bowels.  Bladder within normal limits. Prostate normal.  No free air or fluid. No pathologically enlarged intra-abdominal or pelvic lymph nodes.  No acute osseous abnormality. No worrisome lytic or blastic osseous lesions.  IMPRESSION: 1. No CT evidence for nephrolithiasis or obstructive uropathy. 2. No other acute intra-abdominal or pelvic process. 3. Status post cholecystectomy.   Electronically Signed By: Jeannine Boga M.D. On: 08/31/2015 01:33      INITIAL IMPRESSION / ASSESSMENT AND PLAN / ED COURSE  Pertinent labs & imaging results that were available during my care of the patient were reviewed by me and considered in my medical decision making (see chart for details).  No clear etiology for the patient's flank pain noted.  No hematuria noted on the patient's urinalysis. We'll refer the patient to urology for further outpatient evaluation  ____________________________________________   FINAL CLINICAL IMPRESSION(S) / ED DIAGNOSES  Final diagnoses:  Left flank pain      Gregor Hams, MD 08/31/15 [phone]

## 2015-08-31 NOTE — Discharge Instructions (Signed)
Flank Pain °Flank pain refers to pain that is located on the side of the body between the upper abdomen and the back. The pain may occur over a short period of time (acute) or may be long-term or reoccurring (chronic). It may be mild or severe. Flank pain can be caused by many things. °CAUSES  °Some of the more common causes of flank pain include: °· Muscle strains.   °· Muscle spasms.   °· A disease of your spine (vertebral disk disease).   °· A lung infection (pneumonia).   °· Fluid around your lungs (pulmonary edema).   °· A kidney infection.   °· Kidney stones.   °· A very painful skin rash caused by the chickenpox virus (shingles).   °· Gallbladder disease.   °HOME CARE INSTRUCTIONS  °Home care will depend on the cause of your pain. In general, °· Rest as directed by your caregiver. °· Drink enough fluids to keep your urine clear or pale yellow. °· Only take over-the-counter or prescription medicines as directed by your caregiver. Some medicines may help relieve the pain. °· Tell your caregiver about any changes in your pain. °· Follow up with your caregiver as directed. °SEEK IMMEDIATE MEDICAL CARE IF:  °· Your pain is not controlled with medicine.   °· You have new or worsening symptoms. °· Your pain increases.   °· You have abdominal pain.   °· You have shortness of breath.   °· You have persistent nausea or vomiting.   °· You have swelling in your abdomen.   °· You feel faint or pass out.   °· You have blood in your urine. °· You have a fever or persistent symptoms for more than 2-3 days. °· You have a fever and your symptoms suddenly get worse. °MAKE SURE YOU:  °· Understand these instructions. °· Will watch your condition. °· Will get help right away if you are not doing well or get worse. °  °This information is not intended to replace advice given to you by your health care provider. Make sure you discuss any questions you have with your health care provider. °  °Document Released: 09/10/2005 Document  Revised: 04/13/2012 Document Reviewed: 03/03/2012 °Elsevier Interactive Patient Education ©2016 [employer] ° °

## 2015-09-12 ENCOUNTER — Encounter: Payer: Self-pay | Admitting: Anesthesiology

## 2015-09-12 ENCOUNTER — Other Ambulatory Visit: Payer: Self-pay | Admitting: Anesthesiology

## 2015-09-12 ENCOUNTER — Ambulatory Visit: Payer: BLUE CROSS/BLUE SHIELD | Attending: Anesthesiology | Admitting: Anesthesiology

## 2015-09-12 VITALS — BP 116/70 | HR 75 | Temp 97.9°F | Resp 18 | Ht 71.0 in | Wt 291.0 lb

## 2015-09-12 DIAGNOSIS — Z9889 Other specified postprocedural states: Secondary | ICD-10-CM

## 2015-09-12 DIAGNOSIS — M51369 Other intervertebral disc degeneration, lumbar region without mention of lumbar back pain or lower extremity pain: Secondary | ICD-10-CM

## 2015-09-12 DIAGNOSIS — M5116 Intervertebral disc disorders with radiculopathy, lumbar region: Secondary | ICD-10-CM | POA: Insufficient documentation

## 2015-09-12 DIAGNOSIS — M5136 Other intervertebral disc degeneration, lumbar region: Secondary | ICD-10-CM

## 2015-09-12 DIAGNOSIS — M5417 Radiculopathy, lumbosacral region: Secondary | ICD-10-CM

## 2015-09-12 DIAGNOSIS — G8929 Other chronic pain: Secondary | ICD-10-CM | POA: Insufficient documentation

## 2015-09-12 DIAGNOSIS — M545 Low back pain, unspecified: Secondary | ICD-10-CM

## 2015-09-12 HISTORY — DX: Other intervertebral disc degeneration, lumbar region: M51.36

## 2015-09-12 HISTORY — DX: Other specified postprocedural states: Z98.890

## 2015-09-12 HISTORY — DX: Other intervertebral disc degeneration, lumbar region without mention of lumbar back pain or lower extremity pain: M51.369

## 2015-09-12 HISTORY — DX: Radiculopathy, lumbosacral region: M54.17

## 2015-09-12 HISTORY — DX: Low back pain, unspecified: M54.50

## 2015-09-12 MED ORDER — MELOXICAM 7.5 MG PO TABS: 7.5000 mg | ORAL_TABLET | Freq: Two times a day (BID) | ORAL | Status: DC

## 2015-09-12 NOTE — Progress Notes (Signed)
Subjective:    Patient ID: Gerald Harris, male    DOB: [birth year], 45 y.o.   MRN: [id number]  HPI  His patient is a pleasant and delightful 45 year old  Man who presents with chronic low back pain The pain is bilateral in its location and it radiates into the right hip down the and ends in his right foot all his right toes. The patient indicates that his pain is not associated with  Trauma He indicates that this pain developed 4 months ago He had MRI and CT scans and the MRI report indicated that he has severe degenerative disc disease atL4 and L5 with neural foraminal stenosis  Also at L4 and L5  Pain intensity rating Subjective pain intensity rating is 60% His pain is relieved by lying on a heating pad and taking his pain medications His pain is aggravated by sitting for long periods walking and lifting heavy objects  Pain medication The patient takes Percocet 3/325 one tablet every 6 hours and he also takes ibuprofen 200 mg 3 time  Other medications Other medications include Paxil for PTSD lisinopril hydrochlorothiazide  And Klonopin  Allergies Patient is allergic to Cipro which produces severe anaphylactic shock when it is administered  Past medical history Past medical history is positive for 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  Past surgical history Past surgical history is right knee surgery cholecystectomyand exploratorylaparotomy for bile duct leakage,  Social and economic history Patient smoked one pack of cigarettes per week for 2 years and stopped smoking 20 years ago He does not use alcohol because of his pituitary lesion He used marijuana and other recreational drugs as a teenager but has not used them in his adult life Patient works as an Scientist, physiological in [employer]  Family history Patient is single and divorced and has custody of his 3 children one of whom is biological aged 71 and the other 2  stepchildren ages 36 and 87 years His mother is 58 years old and she is alive but suffers  Atrial fibrillatio His father is alive at age 67 and has kidney stones and skin cancer He has no brothers He has no sisters Review of Systems  Constitutional: Negative.  Negative for fever, chills, diaphoresis, activity change, appetite change, fatigue and unexpected weight change.  HENT: Negative.  Negative for congestion, dental problem, drooling, ear discharge, ear pain, facial swelling, hearing loss, mouth sores, nosebleeds, postnasal drip, rhinorrhea, sinus pressure, sneezing, sore throat, tinnitus, trouble swallowing and voice change.   Eyes: Negative.  Negative for photophobia, pain, discharge, redness, itching and visual disturbance.  Respiratory: Negative.  Negative for apnea, cough, choking, chest tightness, shortness of breath, wheezing and stridor.   Cardiovascular: Negative for chest pain, palpitations and leg swelling.  Gastrointestinal: Negative for nausea, vomiting, abdominal pain, diarrhea, constipation, blood in stool, abdominal distention, anal bleeding and rectal pain.  Endocrine: Negative for cold intolerance, heat intolerance, polydipsia, polyphagia and polyuria.       The patient had a pituitary gland tumor which was treated nonsurgically with steroids As a result of this lesion he has become very sensitive to alcohol and as a consequence he cannot drink any alcohol  Genitourinary: Negative.  Negative for dysuria, urgency, frequency, hematuria, flank pain, decreased urine volume, discharge, penile swelling, scrotal swelling, enuresis, difficulty urinating, genital sores, penile pain and testicular pain.  Musculoskeletal: Positive for myalgias, back pain, arthralgias and gait problem. Negative for joint swelling, neck pain  and neck stiffness.  Skin: Negative.  Negative for color change, pallor, rash and wound.  Allergic/Immunologic: Negative.  Negative for environmental allergies, food  allergies and immunocompromised state.  Neurological: Positive for numbness. Negative for dizziness, tremors, seizures, syncope, facial asymmetry, speech difficulty, weakness, light-headedness and headaches.  Hematological: Negative.  Negative for adenopathy. Does not bruise/bleed easily.  Psychiatric/Behavioral: Negative.  Negative for suicidal ideas, hallucinations, behavioral problems, confusion, sleep disturbance, self-injury, dysphoric mood, decreased concentration and agitation. The patient is not nervous/anxious and is not hyperactive.   ,     Objective:   Physical Exam  Constitutional: He is oriented to person, place, and time. He appears well-developed and well-nourished. No distress.  HENT:  Head: Normocephalic and atraumatic.  Right Ear: External ear normal.  Left Ear: External ear normal.  Nose: Nose normal.  Mouth/Throat: No oropharyngeal exudate.  Eyes: Conjunctivae and EOM are normal. Pupils are equal, round, and reactive to light. Right eye exhibits no discharge. Left eye exhibits no discharge. No scleral icterus.  Neck: Normal range of motion. Neck supple. No JVD present. No tracheal deviation present. No thyromegaly present.  Cardiovascular: Normal rate, regular rhythm, normal heart sounds and intact distal pulses.  Exam reveals no gallop and no friction rub.   No murmur heard. Pulmonary/Chest: Effort normal and breath sounds normal. No respiratory distress. He has no wheezes. He has no rales.  Abdominal: Soft. Bowel sounds are normal. He exhibits no distension and no mass. There is no tenderness. There is no rebound and no guarding.  Genitourinary:  Genitourinary examination was deferred  Musculoskeletal: Normal range of motion. He exhibits tenderness. He exhibits no edema.  Lymphadenopathy:    He has no cervical adenopathy.  Neurological: He is alert and oriented to person, place, and time. He has normal reflexes. No cranial nerve deficit. He exhibits normal muscle  tone. Coordination normal.  Skin: Skin is warm and dry. No rash noted. He is not diaphoretic. No erythema. No pallor.  Psychiatric: He has a normal mood and affect. His behavior is normal. Judgment and thought content normal.  Nursing note and vitals reviewed.         Assessment & Plan:   Assessment 1 Chronic Low Back Pain 2 Lumbar DDD 3 R Lumbar Radiculopathy 4 S/P Back Surgery   Plan of Management 1 Caudal ESI 2 Bilateral Transforaminal ESI at L4 ands L5 3 Me;oxicam 7.5 mg BID. PC # 42 tabs 4Use Percocet on a PRN basis 5 D/C Ibuprofen   New Patient     Level 4   Lance Bosch MD

## 2015-09-12 NOTE — Patient Instructions (Signed)
GENERAL RISKS AND COMPLICATIONS  What are the risk, side effects and possible complications? Generally speaking, most procedures are safe.  However, with any procedure there are risks, side effects, and the possibility of complications.  The risks and complications are dependent upon the sites that are lesioned, or the type of nerve block to be performed.  The closer the procedure is to the spine, the more serious the risks are.  Great care is taken when placing the radio frequency needles, block needles or lesioning probes, but sometimes complications can occur. 1. Infection: Any time there is an injection through the skin, there is a risk of infection.  This is why sterile conditions are used for these blocks.  There are four possible types of infection. 1. Localized skin infection. 2. Central Nervous System Infection-This can be in the form of Meningitis, which can be deadly. 3. Epidural Infections-This can be in the form of an epidural abscess, which can cause pressure inside of the spine, causing compression of the spinal cord with subsequent paralysis. This would require an emergency surgery to decompress, and there are no guarantees that the patient would recover from the paralysis. 4. Discitis-This is an infection of the intervertebral discs.  It occurs in about 1% of discography procedures.  It is difficult to treat and it may lead to surgery.        2. Pain: the needles have to go through skin and soft tissues, will cause soreness.       3. Damage to internal structures:  The nerves to be lesioned may be near blood vessels or    other nerves which can be potentially damaged.       4. Bleeding: Bleeding is more common if the patient is taking blood thinners such as  aspirin, Coumadin, Ticiid, Plavix, etc., or if he/she have some genetic predisposition  such as hemophilia. Bleeding into the spinal canal can cause compression of the spinal  cord with subsequent paralysis.  This would require an  emergency surgery to  decompress and there are no guarantees that the patient would recover from the  paralysis.       5. Pneumothorax:  Puncturing of a lung is a possibility, every time a needle is introduced in  the area of the chest or upper back.  Pneumothorax refers to free air around the  collapsed lung(s), inside of the thoracic cavity (chest cavity).  Another two possible  complications related to a similar event would include: Hemothorax and Chylothorax.   These are variations of the Pneumothorax, where instead of air around the collapsed  lung(s), you may have blood or chyle, respectively.       6. Spinal headaches: They may occur with any procedures in the area of the spine.       7. Persistent CSF (Cerebro-Spinal Fluid) leakage: This is a rare problem, but may occur  with prolonged intrathecal or epidural catheters either due to the formation of a fistulous  track or a dural tear.       8. Nerve damage: By working so close to the spinal cord, there is always a possibility of  nerve damage, which could be as serious as a permanent spinal cord injury with  paralysis.       9. Death:  Although rare, severe deadly allergic reactions known as "Anaphylactic  reaction" can occur to any of the medications used.      10. Worsening of the symptoms:  We can always make thing worse.    What are the chances of something like this happening? Chances of any of this occuring are extremely low.  By statistics, you have more of a chance of getting killed in a motor vehicle accident: while driving to the hospital than any of the above occurring .  Nevertheless, you should be aware that they are possibilities.  In general, it is similar to taking a shower.  Everybody knows that you can slip, hit your head and get killed.  Does that mean that you should not shower again?  Nevertheless always keep in mind that statistics do not mean anything if you happen to be on the wrong side of them.  Even if a procedure has a 1  (one) in a 1,000,000 (million) chance of going wrong, it you happen to be that one..Also, keep in mind that by statistics, you have more of a chance of having something go wrong when taking medications.  Who should not have this procedure? If you are on a blood thinning medication (e.g. Coumadin, Plavix, see list of "Blood Thinners"), or if you have an active infection going on, you should not have the procedure.  If you are taking any blood thinners, please inform your physician.  How should I prepare for this procedure?  Do not eat or drink anything at least six hours prior to the procedure.  Bring a driver with you .  It cannot be a taxi.  Come accompanied by an adult that can drive you back, and that is strong enough to help you if your legs get weak or numb from the local anesthetic.  Take all of your medicines the morning of the procedure with just enough water to swallow them.  If you have diabetes, make sure that you are scheduled to have your procedure done first thing in the morning, whenever possible.  If you have diabetes, take only half of your insulin dose and notify our nurse that you have done so as soon as you arrive at the clinic.  If you are diabetic, but only take blood sugar pills (oral hypoglycemic), then do not take them on the morning of your procedure.  You may take them after you have had the procedure.  Do not take aspirin or any aspirin-containing medications, at least eleven (11) days prior to the procedure.  They may prolong bleeding.  Wear loose fitting clothing that may be easy to take off and that you would not mind if it got stained with Betadine or blood.  Do not wear any jewelry or perfume  Remove any nail coloring.  It will interfere with some of our monitoring equipment.  NOTE: Remember that this is not meant to be interpreted as a complete list of all possible complications.  Unforeseen problems may occur.  BLOOD THINNERS The following drugs  contain aspirin or other products, which can cause increased bleeding during surgery and should not be taken for 2 weeks prior to and 1 week after surgery.  If you should need take something for relief of minor pain, you may take acetaminophen which is found in Tylenol,m Datril, Anacin-3 and Panadol. It is not blood thinner. The products listed below are.  Do not take any of the products listed below in addition to any listed on your instruction sheet.  A.P.C or A.P.C with Codeine Codeine Phosphate Capsules #3 Ibuprofen Ridaura  ABC compound Congesprin Imuran rimadil  Advil Cope Indocin Robaxisal  Alka-Seltzer Effervescent Pain Reliever and Antacid Coricidin or Coricidin-D  Indomethacin Rufen    Alka-Seltzer plus Cold Medicine Cosprin Ketoprofen S-A-C Tablets  Anacin Analgesic Tablets or Capsules Coumadin Korlgesic Salflex  Anacin Extra Strength Analgesic tablets or capsules CP-2 Tablets Lanoril Salicylate  Anaprox Cuprimine Capsules Levenox Salocol  Anexsia-D Dalteparin Magan Salsalate  Anodynos Darvon compound Magnesium Salicylate Sine-off  Ansaid Dasin Capsules Magsal Sodium Salicylate  Anturane Depen Capsules Marnal Soma  APF Arthritis pain formula Dewitt's Pills Measurin Stanback  Argesic Dia-Gesic Meclofenamic Sulfinpyrazone  Arthritis Bayer Timed Release Aspirin Diclofenac Meclomen Sulindac  Arthritis pain formula Anacin Dicumarol Medipren Supac  Analgesic (Safety coated) Arthralgen Diffunasal Mefanamic Suprofen  Arthritis Strength Bufferin Dihydrocodeine Mepro Compound Suprol  Arthropan liquid Dopirydamole Methcarbomol with Aspirin Synalgos  ASA tablets/Enseals Disalcid Micrainin Tagament  Ascriptin Doan's Midol Talwin  Ascriptin A/D Dolene Mobidin Tanderil  Ascriptin Extra Strength Dolobid Moblgesic Ticlid  Ascriptin with Codeine Doloprin or Doloprin with Codeine Momentum Tolectin  Asperbuf Duoprin Mono-gesic Trendar  Aspergum Duradyne Motrin or Motrin IB Triminicin  Aspirin  plain, buffered or enteric coated Durasal Myochrisine Trigesic  Aspirin Suppositories Easprin Nalfon Trillsate  Aspirin with Codeine Ecotrin Regular or Extra Strength Naprosyn Uracel  Atromid-S Efficin Naproxen Ursinus  Auranofin Capsules Elmiron Neocylate Vanquish  Axotal Emagrin Norgesic Verin  Azathioprine Empirin or Empirin with Codeine Normiflo Vitamin E  Azolid Emprazil Nuprin Voltaren  Bayer Aspirin plain, buffered or children's or timed BC Tablets or powders Encaprin Orgaran Warfarin Sodium  Buff-a-Comp Enoxaparin Orudis Zorpin  Buff-a-Comp with Codeine Equegesic Os-Cal-Gesic   Buffaprin Excedrin plain, buffered or Extra Strength Oxalid   Bufferin Arthritis Strength Feldene Oxphenbutazone   Bufferin plain or Extra Strength Feldene Capsules Oxycodone with Aspirin   Bufferin with Codeine Fenoprofen Fenoprofen Pabalate or Pabalate-SF   Buffets II Flogesic Panagesic   Buffinol plain or Extra Strength Florinal or Florinal with Codeine Panwarfarin   Buf-Tabs Flurbiprofen Penicillamine   Butalbital Compound Four-way cold tablets Penicillin   Butazolidin Fragmin Pepto-Bismol   Carbenicillin Geminisyn Percodan   Carna Arthritis Reliever Geopen Persantine   Carprofen Gold's salt Persistin   Chloramphenicol Goody's Phenylbutazone   Chloromycetin Haltrain Piroxlcam   Clmetidine heparin Plaquenil   Cllnoril Hyco-pap Ponstel   Clofibrate Hydroxy chloroquine Propoxyphen         Before stopping any of these medications, be sure to consult the physician who ordered them.  Some, such as Coumadin (Warfarin) are ordered to prevent or treat serious conditions such as "deep thrombosis", "pumonary embolisms", and other heart problems.  The amount of time that you may need off of the medication may also vary with the medication and the reason for which you were taking it.  If you are taking any of these medications, please make sure you notify your pain physician before you undergo any  procedures.         Epidural Steroid Injection Patient Information  Description: The epidural space surrounds the nerves as they exit the spinal cord.  In some patients, the nerves can be compressed and inflamed by a bulging disc or a tight spinal canal (spinal stenosis).  By injecting steroids into the epidural space, we can bring irritated nerves into direct contact with a potentially helpful medication.  These steroids act directly on the irritated nerves and can reduce swelling and inflammation which often leads to decreased pain.  Epidural steroids may be injected anywhere along the spine and from the neck to the low back depending upon the location of your pain.   After numbing the skin with local anesthetic (like Novocaine), a small needle is passed   into the epidural space slowly.  You may experience a sensation of pressure while this is being done.  The entire block usually last less than 10 minutes.  Conditions which may be treated by epidural steroids:   Low back and leg pain  Neck and arm pain  Spinal stenosis  Post-laminectomy syndrome  Herpes zoster (shingles) pain  Pain from compression fractures  Preparation for the injection:  1. Do not eat any solid food or dairy products within 6 hours of your appointment.  2. You may drink clear liquids up to 2 hours before appointment.  Clear liquids include water, black coffee, juice or soda.  No milk or cream please. 3. You may take your regular medication, including pain medications, with a sip of water before your appointment  Diabetics should hold regular insulin (if taken separately) and take 1/2 normal NPH dos the morning of the procedure.  Carry some sugar containing items with you to your appointment. 4. A driver must accompany you and be prepared to drive you home after your procedure.  5. Bring all your current medications with your. 6. An IV may be inserted and sedation may be given at the discretion of the  physician.   7. A blood pressure cuff, EKG and other monitors will often be applied during the procedure.  Some patients may need to have extra oxygen administered for a short period. 8. You will be asked to provide medical information, including your allergies, prior to the procedure.  We must know immediately if you are taking blood thinners (like Coumadin/Warfarin)  Or if you are allergic to IV iodine contrast (dye). We must know if you could possible be pregnant.  Possible side-effects:  Bleeding from needle site  Infection (rare, may require surgery)  Nerve injury (rare)  Numbness & tingling (temporary)  Difficulty urinating (rare, temporary)  Spinal headache ( a headache worse with upright posture)  Light -headedness (temporary)  Pain at injection site (several days)  Decreased blood pressure (temporary)  Weakness in arm/leg (temporary)  Pressure sensation in back/neck (temporary)  Call if you experience:  Fever/chills associated with headache or increased back/neck pain.  Headache worsened by an upright position.  New onset weakness or numbness of an extremity below the injection site  Hives or difficulty breathing (go to the emergency room)  Inflammation or drainage at the infection site  Severe back/neck pain  Any new symptoms which are concerning to you  Please note:  Although the local anesthetic injected can often make your back or neck feel good for several hours after the injection, the pain will likely return.  It takes 3-7 days for steroids to work in the epidural space.  You may not notice any pain relief for at least that one week.  If effective, we will often do a series of three injections spaced 3-6 weeks apart to maximally decrease your pain.  After the initial series, we generally will wait several months before considering a repeat injection of the same type.  If you have any questions, please call [phone] [facility] 

## 2015-09-13 ENCOUNTER — Encounter: Payer: Self-pay | Admitting: Anesthesiology

## 2015-09-13 ENCOUNTER — Ambulatory Visit: Payer: BLUE CROSS/BLUE SHIELD | Attending: Anesthesiology | Admitting: Anesthesiology

## 2015-09-13 VITALS — BP 122/68 | HR 63 | Temp 98.0°F | Resp 16 | Ht 71.0 in | Wt 291.0 lb

## 2015-09-13 DIAGNOSIS — M5116 Intervertebral disc disorders with radiculopathy, lumbar region: Secondary | ICD-10-CM | POA: Insufficient documentation

## 2015-09-13 DIAGNOSIS — Z9889 Other specified postprocedural states: Secondary | ICD-10-CM | POA: Diagnosis not present

## 2015-09-13 DIAGNOSIS — M5417 Radiculopathy, lumbosacral region: Secondary | ICD-10-CM

## 2015-09-13 DIAGNOSIS — G8929 Other chronic pain: Secondary | ICD-10-CM | POA: Diagnosis not present

## 2015-09-13 DIAGNOSIS — M545 Low back pain, unspecified: Secondary | ICD-10-CM

## 2015-09-13 DIAGNOSIS — M5136 Other intervertebral disc degeneration, lumbar region: Secondary | ICD-10-CM

## 2015-09-13 MED ORDER — BUPIVACAINE HCL (PF) 0.25 % IJ SOLN
INTRAMUSCULAR | Status: AC
  Administered 2015-09-13: 13:00:00
  Filled 2015-09-13: qty 30

## 2015-09-13 MED ORDER — FENTANYL CITRATE (PF) 100 MCG/2ML IJ SOLN
INTRAMUSCULAR | Status: AC
  Administered 2015-09-13: 100 ug via INTRAVENOUS
  Filled 2015-09-13: qty 2

## 2015-09-13 MED ORDER — BUPIVACAINE HCL (PF) 0.25 % IJ SOLN: 20.0000 mL | Freq: Once | INTRAMUSCULAR | Status: DC

## 2015-09-13 MED ORDER — IOHEXOL 180 MG/ML  SOLN: 20.0000 mL | INTRAMUSCULAR | Status: DC | PRN

## 2015-09-13 MED ORDER — MIDAZOLAM HCL 5 MG/5ML IJ SOLN: 1.0000 mg | INTRAMUSCULAR | Status: DC

## 2015-09-13 MED ORDER — TRIAMCINOLONE ACETONIDE 40 MG/ML IJ SUSP: 80.0000 mg | Freq: Once | INTRAMUSCULAR | Status: DC

## 2015-09-13 MED ORDER — MIDAZOLAM HCL 5 MG/5ML IJ SOLN
INTRAMUSCULAR | Status: AC
  Administered 2015-09-13: 2 mg via INTRAVENOUS
  Filled 2015-09-13: qty 5

## 2015-09-13 MED ORDER — FENTANYL CITRATE (PF) 100 MCG/2ML IJ SOLN: 50.0000 ug | INTRAMUSCULAR | Status: DC

## 2015-09-13 MED ORDER — IOHEXOL 180 MG/ML  SOLN
INTRAMUSCULAR | Status: AC
  Administered 2015-09-13: 13:00:00
  Filled 2015-09-13: qty 20

## 2015-09-13 MED ORDER — TRIAMCINOLONE ACETONIDE 40 MG/ML IJ SUSP
INTRAMUSCULAR | Status: AC
  Administered 2015-09-13: 13:00:00
  Filled 2015-09-13: qty 2

## 2015-09-13 NOTE — Patient Instructions (Signed)
Pain Management Discharge Instructions  General Discharge Instructions :  If you need to reach your doctor call: Monday-Friday 8:00 am - 4:00 pm at [phone] or toll free [phone].  After clinic hours [phone] to have operator reach doctor.  Bring all of your medication bottles to all your appointments in the pain clinic.  To cancel or reschedule your appointment with Pain Management please remember to call 24 hours in advance to avoid a fee.  Refer to the educational materials which you have been given on: General Risks, I had my Procedure. Discharge Instructions, Post Sedation.  Post Procedure Instructions:  The drugs you were given will stay in your system until tomorrow, so for the next 24 hours you should not drive, make any legal decisions or drink any alcoholic beverages.  You may eat anything you prefer, but it is better to start with liquids then soups and crackers, and gradually work up to solid foods.  Please notify your doctor immediately if you have any unusual bleeding, trouble breathing or pain that is not related to your normal pain.  Depending on the type of procedure that was done, some parts of your body may feel week and/or numb.  This usually clears up by tonight or the next day.  Walk with the use of an assistive device or accompanied by an adult for the 24 hours.  You may use ice on the affected area for the first 24 hours.  Put ice in a Ziploc bag and cover with a towel and place against area 15 minutes on 15 minutes off.  You may switch to heat after 24 hours.Epidural Steroid Injection An epidural steroid injection is given to relieve pain in your neck, back, or legs that is caused by the irritation or swelling of a nerve root. This procedure involves injecting a steroid and numbing medicine (anesthetic) into the epidural space. The epidural space is the space between the outer covering of your spinal cord and the bones that form your backbone  (vertebra).  LET YOUR HEALTH CARE PROVIDER KNOW ABOUT:   Any allergies you have.  All medicines you are taking, including vitamins, herbs, eye drops, creams, and over-the-counter medicines such as aspirin.  Previous problems you or members of your family have had with the use of anesthetics.  Any blood disorders or blood clotting disorders you have.  Previous surgeries you have had.  Medical conditions you have. RISKS AND COMPLICATIONS Generally, this is a safe procedure. However, as with any procedure, complications can occur. Possible complications of epidural steroid injection include:  Headache.  Bleeding.  Infection.  Allergic reaction to the medicines.  Damage to your nerves. The response to this procedure depends on the underlying cause of the pain and its duration. People who have long-term (chronic) pain are less likely to benefit from epidural steroids than are those people whose pain comes on strong and suddenly. BEFORE THE PROCEDURE   Ask your health care provider about changing or stopping your regular medicines. You may be advised to stop taking blood-thinning medicines a few days before the procedure.  You may be given medicines to reduce anxiety.  Arrange for someone to take you home after the procedure. PROCEDURE   You will remain awake during the procedure. You may receive medicine to make you relaxed.  You will be asked to lie on your stomach.  The injection site will be cleaned.  The injection site will be numbed with a medicine (local anesthetic).  A needle will be   injected through your skin into the epidural space.  Your health care provider will use an X-ray machine to ensure that the steroid is delivered closest to the affected nerve. You may have minimal discomfort at this time.  Once the needle is in the right position, the local anesthetic and the steroid will be injected into the epidural space.  The needle will then be removed and a  bandage will be applied to the injection site. AFTER THE PROCEDURE   You may be monitored for a short time before you go home.  You may feel weakness or numbness in your arm or leg, which disappears within hours.  You may be allowed to eat, drink, and take your regular medicine.  You may have soreness at the site of the injection.   This information is not intended to replace advice given to you by your health care provider. Make sure you discuss any questions you have with your health care provider.   Document Released: 10/27/2007 Document Revised: 03/22/2013 Document Reviewed: 01/06/2013 Elsevier Interactive Patient Education 2016 [employer]  

## 2015-09-13 NOTE — Procedures (Signed)
Date of procedure: 09/13/2015  Preoperative Diagnosis:  1 chronic low back pain 2 lumbar degenerative disc disease 3 lumbar radiculopathy  Postoperative Diagnosis:  Same.  Procedure: 1. Caudal epidural steroid injection, 2. Epidural with interpretation. 3. Fluoroscopic guidance.  Surgeon: Lance Bosch, MD  Anesthesia: MAC anesthesia by the nurse and staff under my direction  Informed consent was obtained and the patient appeared to accept and understand the benefits and risks of this procedure.   Pre procedure comments:  None  Description of the Procedure:  The patient was taken to the operating room and placed in the prone position.  Intravenous sedation and MAC anesthesia was administered by the nurse and staff under my direction. After appropriate sedation, the sacrococcygeal area was prepped with Betadine.  After adequate draping, the area between the sacral cornu was palpated and infiltrated with 3 cc of 1% Lidocaine.   An AP fluoroscopic view of the sacrum was visualized and a 17 gauge Tuohy needle was inserted in the midline at the angle of 45 degrees through the sacrococcygeal membrane.  After making contact with the bone, the needle was withdrawn and readvanced in horizontal position, into the caudal epidural space.  Epidurogram Study: One cc of Omnipaque 180 was injected through the needle and epidurogram was visualized in both the later and AP views. After injected the contrast media through the Touhy needle, the spread of the dye was observed to remove catheter lab as high as L4 Distribution of the contrast was bilateral at L4 S1 and S2 However at the L5 level the spread was clear on the right side but it was obstructed on the left side suggesting foraminal stenosis at that level  Comments:   No catheter was used   Caudal Epidural Steroid Injection:  Then 10 cc of 0.25% Bupivacaine and 25 mg of Kenalog were injected into the Caudal epidural space.  The needle  was removed and adequate hemostasis was established.    The patient tolerated the procedure quite well and vital signs were stable.  There were no adverse effects.  Additional comments: This procedure was done using fluoroscopic guidance Fluoroscopic time was 0.1 minutes Number of fluoroscopic frames with 2 MG Y was 5.4    The patient was taken to the recovery room in satisfactory condition where the patient was observed and subsequently discharged home.  Will follow up in the clinic in the next week.  Lance Bosch M.D.

## 2015-09-13 NOTE — Progress Notes (Signed)
Safety precautions to be maintained throughout the outpatient stay will include: orient to surroundings, keep bed in low position, maintain call bell within reach at all times, provide assistance with transfer out of bed and ambulation.  

## 2015-09-16 ENCOUNTER — Telehealth: Payer: Self-pay | Admitting: *Deleted

## 2015-09-16 NOTE — Telephone Encounter (Signed)
Left voicemail for patient to call our office if he has questions or concerns re; procedure on Friday.

## 2015-09-20 ENCOUNTER — Telehealth: Payer: Self-pay

## 2015-09-20 LAB — TOXASSURE SELECT 13 (MW), URINE: PDF: 0

## 2015-09-20 NOTE — Telephone Encounter (Signed)
Patient states his pain is not getting any better.  Informed patient that it takes 4-10 days for the steroid to start working.  Patient would like to talk to  Dr Idelia Salm about disability.  Appointment scheduled by Juliann Pulse.

## 2015-09-20 NOTE — Telephone Encounter (Signed)
Patient had procedure with Dr. Idelia Salm on Feb. 10. He has questions and says it did not help. He wants someone to call him.

## 2015-10-09 ENCOUNTER — Ambulatory Visit: Payer: BLUE CROSS/BLUE SHIELD | Attending: Anesthesiology | Admitting: Anesthesiology

## 2015-10-09 ENCOUNTER — Encounter: Payer: Self-pay | Admitting: Anesthesiology

## 2015-10-09 VITALS — BP 121/67 | HR 61 | Temp 98.7°F | Resp 16 | Ht 71.0 in | Wt 290.0 lb

## 2015-10-09 DIAGNOSIS — M5417 Radiculopathy, lumbosacral region: Secondary | ICD-10-CM

## 2015-10-09 DIAGNOSIS — M545 Low back pain, unspecified: Secondary | ICD-10-CM

## 2015-10-09 DIAGNOSIS — Z9889 Other specified postprocedural states: Secondary | ICD-10-CM

## 2015-10-09 DIAGNOSIS — M51369 Other intervertebral disc degeneration, lumbar region without mention of lumbar back pain or lower extremity pain: Secondary | ICD-10-CM

## 2015-10-09 DIAGNOSIS — M5136 Other intervertebral disc degeneration, lumbar region: Secondary | ICD-10-CM | POA: Diagnosis not present

## 2015-10-09 MED ORDER — IOHEXOL 180 MG/ML  SOLN: 20.0000 mL | INTRAMUSCULAR | Status: DC | PRN

## 2015-10-09 MED ORDER — FENTANYL CITRATE (PF) 100 MCG/2ML IJ SOLN
INTRAMUSCULAR | Status: AC
Start: 2015-10-09 — End: 2015-10-09
  Administered 2015-10-09: 50 ug via INTRAVENOUS
  Filled 2015-10-09: qty 2

## 2015-10-09 MED ORDER — MIDAZOLAM HCL 5 MG/5ML IJ SOLN: 1.0000 mg | INTRAMUSCULAR | Status: DC

## 2015-10-09 MED ORDER — OXYCODONE-ACETAMINOPHEN 5-325 MG PO TABS: 1.0000 | ORAL_TABLET | Freq: Three times a day (TID) | ORAL | Status: DC | PRN

## 2015-10-09 MED ORDER — FENTANYL CITRATE (PF) 100 MCG/2ML IJ SOLN: 50.0000 ug | INTRAMUSCULAR | Status: DC

## 2015-10-09 MED ORDER — TRIAMCINOLONE ACETONIDE 40 MG/ML IJ SUSP: 80.0000 mg | Freq: Once | INTRAMUSCULAR | Status: DC

## 2015-10-09 MED ORDER — TRIAMCINOLONE ACETONIDE 40 MG/ML IJ SUSP
INTRAMUSCULAR | Status: AC
  Administered 2015-10-09: 13:00:00
  Filled 2015-10-09: qty 1

## 2015-10-09 MED ORDER — BUPIVACAINE HCL (PF) 0.25 % IJ SOLN: 20.0000 mL | Freq: Once | INTRAMUSCULAR | Status: DC

## 2015-10-09 MED ORDER — IOHEXOL 180 MG/ML  SOLN
INTRAMUSCULAR | Status: AC
Start: 2015-10-09 — End: 2015-10-09
  Administered 2015-10-09: 13:00:00
  Filled 2015-10-09: qty 20

## 2015-10-09 MED ORDER — MIDAZOLAM HCL 5 MG/5ML IJ SOLN
INTRAMUSCULAR | Status: AC
  Administered 2015-10-09: 2 mg via INTRAVENOUS
  Filled 2015-10-09: qty 5

## 2015-10-09 MED ORDER — BUPIVACAINE HCL (PF) 0.25 % IJ SOLN
INTRAMUSCULAR | Status: AC
  Filled 2015-10-09: qty 30

## 2015-10-09 NOTE — Progress Notes (Unsigned)
   Subjective:    Patient ID: Gerald Harris, male    DOB: 09/04/1970, 45 y.o.   MRN: [id number]  HPI  This patient comes into clinic today indicating that he got about 2 days adequate pain relief following the caudal epidural steroid injection By the third day the pain had resumed and today his subjective pain intensity rating is 70% He is having tremendous difficulties at work in the sense that he is not able to work and his supervisor is asking for resolution of his problem They have advised that he applied for short-term disability I advised him that I have not completed my treatment and that today I would do a bilateral transforaminal lumbar epidural steroid injection for him at L4 and L5 If he does not get significance response to this therapy then we may have to look at other options  Review of Systems  Constitutional: Negative.   HENT: Negative.   Eyes: Negative.   Respiratory: Negative.   Endocrine: Negative.   Genitourinary: Negative.   Musculoskeletal: Positive for myalgias, back pain, arthralgias and gait problem. Negative for joint swelling and neck stiffness.       This patient has continued low back pain with radiculopathy  Skin: Negative.   Allergic/Immunologic: Negative.   Neurological: Negative.   Hematological: Negative.   Psychiatric/Behavioral: Negative.        Objective:   Physical Exam  Cardiovascular:  This patient is in some moderate distress His subjective pain intensity rating is 70 % His vital signs are stable and there are no new neurological findings  Vitals reviewed.         Assessment & Plan:    Assessment 1 chronic low back pain 2 lumbar degenerative disc disease 3 lumbar radiculopathy 4 status post lumbar spine surgery   Plan of management 1 Will plan a bilateral lumbar transforaminal epidural steroid injection at L4-L5 today 2 will give him a prescription to keep in reserve if his pain returns while I am out of town 3 Will  follow-up with him in 1 month   Established patient   level II   [place].D.

## 2015-10-09 NOTE — Progress Notes (Unsigned)
Safety precautions to be maintained throughout the outpatient stay will include: orient to surroundings, keep bed in low position, maintain call bell within reach at all times, provide assistance with transfer out of bed and ambulation.  

## 2015-10-09 NOTE — Procedures (Unsigned)
Date of procedure:  10/09/2015  Preoperative Diagnosis:  1 chronic low back pain 2 lumbar degenerative disc disease 3 lumbar radiculopathy  Postoperative Diagnosis:  Same.  Procedure: 1. Bilateral Lumbar transforaminal epidural steroid injection at L4 and L5 2. Epidurogram with interpretation. 3. Fluoroscopic guidance.   Surgeon: Lance Bosch, MD  Anesthesia: MAC anesthesia by the nurse and staff under my direction  Informed consent was obtained and the patient appeared to accept and understand the benefits and risks of this procedure.   Pre procedure comments:  None  Description of the Procedure:  The patient was taken to the operating room and placed in the prone position.  Intravenous sedation and MAC anesthesia was administered by the nurse and staff under my direction.  After appropriate sedation, the lumbosacral area was prepped with Betadine.  Then the patient was fully draped.  Fluoroscopic view of the lumbar spine was done and the pedicle on the right side was visualized.  The skin over the 6 o'clock position of the pedicle on the right was infiltrated with 3 cc of 1% Lidocaine using a 25 gauge needle.  Adequate visualization of pedicle was obtained by using an oblique fluoroscopic view.  Then a 22 gauge 3 1/2 spinal needle was introduced through the skin and directed towards the 6 o'clock position of the pedicles at L 4 and L5.  The needles were advanced into the superior medical portion of the foramen at L 4 and L5.  Then a lateral view was utilized to ensure optimal needle position and depth in the foramen.    Epidurogram with Interpretation:  After negative aspiration, each needle was injected with 1 cc of Omnipaque 180 iin the performance of independent epidurograms for the purposes of detailing the epidural anatomy along with nerve root anatomy at the corresponding levels.  Further, the visualization of the contrast media was also to ensure adequate spread of he  injectate into the presumed affected areas.  No intravascular or intrathecal spread was observed at any level and there were no corresponding paresthesias upon injection.  Epidurograms were visualized along with spread of contrast media into the exiting nerve root.  Transformaraminal Epidural Steroid Injection:  Again aspirations were negative for blood, CSF or other body fluids.  Then 1.5 cc of 0.25%Bupivacaine and 20 mg of Kenalog was injected in the L 4 and L5 foramen.  Fluoroscopic view of the lumbar spine was done and the pedicle on the left side was visualized.  The skin over the 6 o'clock position of the pedicle on the left was infiltrated with 3 cc of 1% Lidocaine using a 25 gauge needle.  Adequate visualization of pedicle was obtained by using an oblique fluoroscopic view.  Then a 22 gauge 3 1/2 spinal needle was introduced through the skin and directed towards the 6 o'clock position of the pedicles at L 4 and L5.  The needles were advanced into the superior medical portion of the foramen at L 4 and L5.  Then a lateral view was utilized to ensure optimal needle position and depth in the foramen.    Epidurogram with Interpretation:  After negative aspiration, each needle was injected with 1 cc of Omnipaque 180 in the performance of independent epidurograms for the purposes of detailing the epidural anatomy along with nerve root anatomy at the corresponding levels.  Further, the visualization of the contrast media was also to ensure adequate spread of he injectate into the presumed affected areas.  No intravascular or intrathecal spread was observed  at any level and there were no corresponding paresthesias upon injection.   Epidurograms were visualized along with spread of contrast media into the exiting nerve root.  Transformaraminal Epidural Steroid Injection:  Again aspirations were negative for blood, CSF or other body fluids.   Then 1.5 cc of 0.25%Bupivacaine and 20 mg of Kenalog was  injected in the L 4 and L5 foramen.  Additional comments This procedure was done with fluoroscopic guidance The following fluoroscopic parameters were used: Fluoroscopic time was 1.2 minutes Scopic frames were 8 MG Y was 27.0   The patient tolerated the procedures quite well.   The needles were removed.   Adequate hemostasis was established and vital signes were stable.  The patient was taken to the recovery room in satisfactory condition where the patient was observed and subsequently discharged home.   The patient was given oxycodone 5 mg to take 1-2 on a when necessary basis for pain and he was given 30 tablets  Will follow up in the clinic in the next month.  Lance Bosch M.D.

## 2015-10-15 ENCOUNTER — Telehealth: Payer: Self-pay | Admitting: Anesthesiology

## 2015-10-15 ENCOUNTER — Other Ambulatory Visit: Payer: Self-pay

## 2015-10-15 ENCOUNTER — Telehealth: Payer: Self-pay | Admitting: *Deleted

## 2015-10-15 NOTE — Telephone Encounter (Signed)
Returned phone call and left voicemail

## 2015-10-15 NOTE — Telephone Encounter (Signed)
Left voicemail with patient, was returning his phone call.

## 2015-10-15 NOTE — Telephone Encounter (Signed)
Wants to speak with Nurse about patient of Dr. Idelia Salm, wanted to speak with Dr. Idelia Salm but informed he would not be back until April Please Call ASAP

## 2015-10-23 ENCOUNTER — Telehealth: Payer: Self-pay

## 2015-10-23 NOTE — Telephone Encounter (Signed)
PT has pain from lower back to down his legs Pt wants pain meds Dr. Idelia Salm told him that you would know how to contact him to get the medicines

## 2015-10-24 NOTE — Telephone Encounter (Signed)
Patient states procedure did not help, and that Dr. Idelia Salm told him to call if he needed pain meds and Gerald Harris could get in touch with him.  Patient advised that no pain meds will be given outside of appt.

## 2015-10-25 ENCOUNTER — Other Ambulatory Visit: Payer: Self-pay | Admitting: *Deleted

## 2015-10-25 MED ORDER — TRAMADOL HCL 50 MG PO TABS: 50.0000 mg | ORAL_TABLET | Freq: Three times a day (TID) | ORAL | Status: DC

## 2015-10-25 NOTE — Telephone Encounter (Signed)
Dr. Idelia Salm e-mailed to ask if he will prescribe pain meds. Dr. Idelia Salm responded, Tramadol 50 mg 1 tab tid with meals, #42. Tramadol called in to [place]. Patient notified per voicemail.

## 2015-11-05 ENCOUNTER — Encounter: Payer: Self-pay | Admitting: Anesthesiology

## 2015-11-05 ENCOUNTER — Ambulatory Visit: Payer: BLUE CROSS/BLUE SHIELD | Attending: Anesthesiology | Admitting: Anesthesiology

## 2015-11-05 VITALS — BP 123/87 | HR 87 | Temp 98.3°F | Resp 16 | Ht 71.0 in | Wt 295.0 lb

## 2015-11-05 DIAGNOSIS — M5116 Intervertebral disc disorders with radiculopathy, lumbar region: Secondary | ICD-10-CM | POA: Diagnosis not present

## 2015-11-05 DIAGNOSIS — M545 Low back pain, unspecified: Secondary | ICD-10-CM

## 2015-11-05 DIAGNOSIS — Z9889 Other specified postprocedural states: Secondary | ICD-10-CM | POA: Insufficient documentation

## 2015-11-05 DIAGNOSIS — M5136 Other intervertebral disc degeneration, lumbar region: Secondary | ICD-10-CM

## 2015-11-05 DIAGNOSIS — G8929 Other chronic pain: Secondary | ICD-10-CM | POA: Diagnosis not present

## 2015-11-05 DIAGNOSIS — M5417 Radiculopathy, lumbosacral region: Secondary | ICD-10-CM

## 2015-11-05 MED ORDER — OXYCODONE-ACETAMINOPHEN 7.5-325 MG PO TABS: 1.0000 | ORAL_TABLET | Freq: Every morning | ORAL | Status: DC

## 2015-11-05 MED ORDER — TRAMADOL HCL 50 MG PO TABS: 50.0000 mg | ORAL_TABLET | ORAL | Status: DC

## 2015-11-05 NOTE — Patient Instructions (Signed)
You were given prescriptions for Percocet and Tramadol today. Epidural Steroid Injection Patient Information  Description: The epidural space surrounds the nerves as they exit the spinal cord.  In some patients, the nerves can be compressed and inflamed by a bulging disc or a tight spinal canal (spinal stenosis).  By injecting steroids into the epidural space, we can bring irritated nerves into direct contact with a potentially helpful medication.  These steroids act directly on the irritated nerves and can reduce swelling and inflammation which often leads to decreased pain.  Epidural steroids may be injected anywhere along the spine and from the neck to the low back depending upon the location of your pain.   After numbing the skin with local anesthetic (like Novocaine), a small needle is passed into the epidural space slowly.  You may experience a sensation of pressure while this is being done.  The entire block usually last less than 10 minutes.  Conditions which may be treated by epidural steroids:   Low back and leg pain  Neck and arm pain  Spinal stenosis  Post-laminectomy syndrome  Herpes zoster (shingles) pain  Pain from compression fractures  Preparation for the injection:  1. Do not eat any solid food or dairy products within 8 hours of your appointment.  2. You may drink clear liquids up to 3 hours before appointment.  Clear liquids include water, black coffee, juice or soda.  No milk or cream please. 3. You may take your regular medication, including pain medications, with a sip of water before your appointment  Diabetics should hold regular insulin (if taken separately) and take 1/2 normal NPH dos the morning of the procedure.  Carry some sugar containing items with you to your appointment. 4. A driver must accompany you and be prepared to drive you home after your procedure.  5. Bring all your current medications with your. 6. An IV may be inserted and sedation may be  given at the discretion of the physician.   7. A blood pressure cuff, EKG and other monitors will often be applied during the procedure.  Some patients may need to have extra oxygen administered for a short period. 8. You will be asked to provide medical information, including your allergies, prior to the procedure.  We must know immediately if you are taking blood thinners (like Coumadin/Warfarin)  Or if you are allergic to IV iodine contrast (dye). We must know if you could possible be pregnant.  Possible side-effects:  Bleeding from needle site  Infection (rare, may require surgery)  Nerve injury (rare)  Numbness & tingling (temporary)  Difficulty urinating (rare, temporary)  Spinal headache ( a headache worse with upright posture)  Light -headedness (temporary)  Pain at injection site (several days)  Decreased blood pressure (temporary)  Weakness in arm/leg (temporary)  Pressure sensation in back/neck (temporary)  Call if you experience:  Fever/chills associated with headache or increased back/neck pain.  Headache worsened by an upright position.  New onset weakness or numbness of an extremity below the injection site  Hives or difficulty breathing (go to the emergency room)  Inflammation or drainage at the infection site  Severe back/neck pain  Any new symptoms which are concerning to you  Please note:  Although the local anesthetic injected can often make your back or neck feel good for several hours after the injection, the pain will likely return.  It takes 3-7 days for steroids to work in the epidural space.  You may not notice any  pain relief for at least that one week.  If effective, we will often do a series of three injections spaced 3-6 weeks apart to maximally decrease your pain.  After the initial series, we generally will wait several months before considering a repeat injection of the same type.  If you have any questions, please call [phone] [facility]

## 2015-11-05 NOTE — Progress Notes (Signed)
Safety precautions to be maintained throughout the outpatient stay will include: orient to surroundings, keep bed in low position, maintain call bell within reach at all times, provide assistance with transfer out of bed and ambulation.  

## 2015-11-05 NOTE — Progress Notes (Signed)
   Subjective:    Patient ID: Gerald Harris, male    DOB: [birth year], 45 y.o.   MRN: [id number]  HPI This patient returned to the clinic today indicating that he is doing so much better He indicates that his pain has decreased by 30% Today his subjective pain intensity rating is 40% He indicated that he got more pain relief from the bilateral transforaminal epidural steroid injection than he got from the caudal epidural steroid injection He also pointed out that he got best relief using the Percocet in the morning and the tramadol at night Accordingly we will adjust his medications to reflect that result   Review of Systems  Constitutional: Negative.   HENT: Negative.   Eyes: Negative.   Respiratory: Negative.   Cardiovascular: Negative.   Gastrointestinal: Negative.   Endocrine: Negative.   Genitourinary: Negative.   Musculoskeletal: Negative.   Skin: Negative.   Allergic/Immunologic: Negative.   Neurological: Negative.   Hematological: Negative.   Psychiatric/Behavioral: Negative.        Objective:   Physical Exam  Cardiovascular:  Patient appears to be in no distress His subjective pain intensity rating is 40% His blood pressure is 123/87 mmHg His pulse is 87 bpm Equal and regular Heart sounds 1 and 2 were heard in all areas There were no audible murmurs Temperature was 98.55F Respirations were 16 breaths per minute SPO2 was 99% Chest is clinically clear There are no adventitious sounds Abdomen was soft and nontender There was no palpable organomegaly There was tenderness in the L4-L5 area bilaterally Range of motion was significantly decreased There were no new neurological or musculoskeletal findings  Nursing note and vitals reviewed.         Assessment & Plan:   Assessment 1 chronic low back pain 2 lumbar degenerative disc disease 3 lumbar radiculopathy 4 status post lumbar spine surgery  Plan of management 1 Will plan a repeat bilateral  lumbar transforaminal epidural steroid injection at L4-L5 2 will give him Percocet 7.5/325 one tablet to be taken in the morning and give him 30 tablets 3 Will give him tramadol 50 mg to be taken at night and give him 30 tablets 4 Will plan to perform the procedure for him on April 7   Established patient      level III    Lance Bosch M.D.

## 2015-11-08 ENCOUNTER — Ambulatory Visit: Payer: BLUE CROSS/BLUE SHIELD | Attending: Anesthesiology | Admitting: Anesthesiology

## 2015-11-08 ENCOUNTER — Encounter: Payer: Self-pay | Admitting: Anesthesiology

## 2015-11-08 VITALS — BP 114/59 | HR 71 | Temp 98.1°F | Resp 14 | Ht 71.0 in | Wt 296.0 lb

## 2015-11-08 DIAGNOSIS — M545 Low back pain, unspecified: Secondary | ICD-10-CM

## 2015-11-08 DIAGNOSIS — G8929 Other chronic pain: Secondary | ICD-10-CM | POA: Insufficient documentation

## 2015-11-08 DIAGNOSIS — Z9889 Other specified postprocedural states: Secondary | ICD-10-CM

## 2015-11-08 DIAGNOSIS — M5417 Radiculopathy, lumbosacral region: Secondary | ICD-10-CM | POA: Insufficient documentation

## 2015-11-08 DIAGNOSIS — M5116 Intervertebral disc disorders with radiculopathy, lumbar region: Secondary | ICD-10-CM | POA: Diagnosis not present

## 2015-11-08 DIAGNOSIS — M5136 Other intervertebral disc degeneration, lumbar region: Secondary | ICD-10-CM

## 2015-11-08 MED ORDER — MIDAZOLAM HCL 5 MG/5ML IJ SOLN
INTRAMUSCULAR | Status: AC
Start: 2015-11-08 — End: 2015-11-08
  Administered 2015-11-08: 2 mg via INTRAVENOUS
  Filled 2015-11-08: qty 5

## 2015-11-08 MED ORDER — FENTANYL CITRATE (PF) 100 MCG/2ML IJ SOLN: 50.0000 ug | INTRAMUSCULAR | Status: DC

## 2015-11-08 MED ORDER — BUPIVACAINE HCL (PF) 0.25 % IJ SOLN
INTRAMUSCULAR | Status: AC
  Administered 2015-11-08: 13:00:00
  Filled 2015-11-08: qty 30

## 2015-11-08 MED ORDER — BUPIVACAINE HCL (PF) 0.25 % IJ SOLN: 20.0000 mL | Freq: Once | INTRAMUSCULAR | Status: DC

## 2015-11-08 MED ORDER — TRIAMCINOLONE ACETONIDE 40 MG/ML IJ SUSP: 80.0000 mg | Freq: Once | INTRAMUSCULAR | Status: DC

## 2015-11-08 MED ORDER — MIDAZOLAM HCL 5 MG/5ML IJ SOLN: 1.0000 mg | INTRAMUSCULAR | Status: DC

## 2015-11-08 MED ORDER — TRIAMCINOLONE ACETONIDE 40 MG/ML IJ SUSP
INTRAMUSCULAR | Status: AC
  Administered 2015-11-08: 13:00:00
  Filled 2015-11-08: qty 2

## 2015-11-08 MED ORDER — IOPAMIDOL (ISOVUE-M 200) INJECTION 41%
INTRAMUSCULAR | Status: AC
Start: 2015-11-08 — End: 2015-11-08
  Administered 2015-11-08: 13:00:00
  Filled 2015-11-08: qty 10

## 2015-11-08 MED ORDER — FENTANYL CITRATE (PF) 100 MCG/2ML IJ SOLN
INTRAMUSCULAR | Status: AC
  Administered 2015-11-08: 100 ug via INTRAVENOUS
  Filled 2015-11-08: qty 2

## 2015-11-08 MED ORDER — IOPAMIDOL (ISOVUE-M 200) INJECTION 41%: 20.0000 mL | INTRAMUSCULAR | Status: DC | PRN

## 2015-11-08 NOTE — Progress Notes (Signed)
Safety precautions to be maintained throughout the outpatient stay will include: orient to surroundings, keep bed in low position, maintain call bell within reach at all times, provide assistance with transfer out of bed and ambulation.  

## 2015-11-08 NOTE — Progress Notes (Signed)
Pt talked with Dr Idelia Salm in great lengths about options and future procedures

## 2015-11-08 NOTE — Patient Instructions (Signed)
Epidural Steroid Injection An epidural steroid injection is given to relieve pain in your neck, back, or legs that is caused by the irritation or swelling of a nerve root. This procedure involves injecting a steroid and numbing medicine (anesthetic) into the epidural space. The epidural space is the space between the outer covering of your spinal cord and the bones that form your backbone (vertebra).  LET YOUR HEALTH CARE PROVIDER KNOW ABOUT:   Any allergies you have.  All medicines you are taking, including vitamins, herbs, eye drops, creams, and over-the-counter medicines such as aspirin.  Previous problems you or members of your family have had with the use of anesthetics.  Any blood disorders or blood clotting disorders you have.  Previous surgeries you have had.  Medical conditions you have. RISKS AND COMPLICATIONS Generally, this is a safe procedure. However, as with any procedure, complications can occur. Possible complications of epidural steroid injection include:  Headache.  Bleeding.  Infection.  Allergic reaction to the medicines.  Damage to your nerves. The response to this procedure depends on the underlying cause of the pain and its duration. People who have long-term (chronic) pain are less likely to benefit from epidural steroids than are those people whose pain comes on strong and suddenly. BEFORE THE PROCEDURE   Ask your health care provider about changing or stopping your regular medicines. You may be advised to stop taking blood-thinning medicines a few days before the procedure.  You may be given medicines to reduce anxiety.  Arrange for someone to take you home after the procedure. PROCEDURE   You will remain awake during the procedure. You may receive medicine to make you relaxed.  You will be asked to lie on your stomach.  The injection site will be cleaned.  The injection site will be numbed with a medicine (local anesthetic).  A needle will be  injected through your skin into the epidural space.  Your health care provider will use an X-ray machine to ensure that the steroid is delivered closest to the affected nerve. You may have minimal discomfort at this time.  Once the needle is in the right position, the local anesthetic and the steroid will be injected into the epidural space.  The needle will then be removed and a bandage will be applied to the injection site. AFTER THE PROCEDURE   You may be monitored for a short time before you go home.  You may feel weakness or numbness in your arm or leg, which disappears within hours.  You may be allowed to eat, drink, and take your regular medicine.  You may have soreness at the site of the injection.   This information is not intended to replace advice given to you by your health care provider. Make sure you discuss any questions you have with your health care provider.   Document Released: 10/27/2007 Document Revised: 03/22/2013 Document Reviewed: 01/06/2013 Elsevier Interactive Patient Education 2016 [employer] Pain Management Discharge Instructions  General Discharge Instructions :  If you need to reach your doctor call: Monday-Friday 8:00 am - 4:00 pm at [phone] or toll free [phone].  After clinic hours [phone] to have operator reach doctor.  Bring all of your medication bottles to all your appointments in the pain clinic.  To cancel or reschedule your appointment with Pain Management please remember to call 24 hours in advance to avoid a fee.  Refer to the educational materials which you have been given on: General Risks, I had my Procedure.   Discharge Instructions, Post Sedation.  Post Procedure Instructions:  The drugs you were given will stay in your system until tomorrow, so for the next 24 hours you should not drive, make any legal decisions or drink any alcoholic beverages.  You may eat anything you prefer, but it is better to start with  liquids then soups and crackers, and gradually work up to solid foods.  Please notify your doctor immediately if you have any unusual bleeding, trouble breathing or pain that is not related to your normal pain.  Depending on the type of procedure that was done, some parts of your body may feel week and/or numb.  This usually clears up by tonight or the next day.  Walk with the use of an assistive device or accompanied by an adult for the 24 hours.  You may use ice on the affected area for the first 24 hours.  Put ice in a Ziploc bag and cover with a towel and place against area 15 minutes on 15 minutes off.  You may switch to heat after 24 hours. 

## 2015-11-08 NOTE — Procedures (Signed)
Date of procedure : 11/08/2015  Preoperative Diagnosis:  1 chronic low back pain 2 lumbar degenerative disc disease 3 lumbar radiculopathy  Postoperative Diagnosis:  Same.  Procedure: 1. Bilateral Lumbar transforaminal epidural steroid injection at L4 and L5 2. Epidurogram with interpretation. 3. Fluoroscopic guidance.   Surgeon: Lance Bosch, MD  Anesthesia: MAC anesthesia by the nurse and staff under my direction  Informed consent was obtained and the patient appeared to accept and understand the benefits and risks of this procedure.   Pre procedure comments:  None  Description of the Procedure:  The patient was taken to the operating room and placed in the prone position.  Intravenous sedation and MAC anesthesia was administered by the nurse and staff under my direction.  After appropriate sedation, the lumbosacral area was prepped with Betadine.  Then the patient was fully draped.  Fluoroscopic view of the lumbar spine was done and the pedicle on the right side was visualized.  The skin over the 6 o'clock position of the pedicle on the right was infiltrated with 3 cc of 1% Lidocaine using a 25 gauge needle.  Adequate visualization of pedicle was obtained by using an oblique fluoroscopic view.  Then a 22 gauge 3 1/2 spinal needle was introduced through the skin and directed towards the 6 o'clock position of the pedicles at L 4 and L5 .  The needles were advanced into the superior medical portion of the foramen at L 4 and L5.  Then a lateral view was utilized to ensure optimal needle position and depth in the foramen.    Epidurogram with Interpretation:  After negative aspiration, each needle was injected with 1 cc of Isovue M 200 in the performance of independent epidurograms for the purposes of detailing the epidural anatomy along with nerve root anatomy at the corresponding levels.  Further, the visualization of the contrast media was also to ensure adequate spread of he  injectate into the presumed affected areas.  No intravascular or intrathecal spread was observed at any level and there were no corresponding paresthesias upon injection.  Epidurograms were visualized along with spread of contrast media into the exiting nerve root.  Transformaraminal Epidural Steroid Injection:  Again aspirations were negative for blood, CSF or other body fluids.  Then 1.5 cc of 0.25%Bupivacaine and 20 mg of Kenalog  was injected in the L4 and L5  foramen.   Fluoroscopic view of the lumbar spine was done and the pedicle on the left side was visualized.  The skin over the 6 o'clock position of the pedicle on the left was infiltrated with 3 cc of 1% Lidocaine using a 25 gauge needle.  Adequate visualization of pedicle was obtained by using an oblique fluoroscopic view.  Then a 22 gauge 3 1/2 spinal needle was introduced through the skin and directed towards the 6 o'clock position of the pedicles at L4 and L5.  The needles were advanced into the superior medical portion of the foramen at L4 and L5.  Then a lateral view was utilized to ensure optimal needle position and depth in the foramen.    Epidurogram with Interpretation:  After negative aspiration, each needle was injected with 1 cc of Isovue M 200 in the performance of independent epidurograms for the purposes of detailing the epidural anatomy along with nerve root anatomy at the corresponding levels.  Further, the visualization of the contrast media was also to ensure adequate spread of he injectate into the presumed affected areas.  No intravascular or intrathecal  spread was observed at any level and there were no corresponding paresthesias upon injection.  Epidurograms were visualized along with spread of contrast media into the exiting nerve root.  Transformaraminal Epidural Steroid Injection:  Again aspirations were negative for blood, CSF or other body fluids.  Then 1.5 cc of 0.25%Bupivacaine and  20 mg of Kenalog was  injected in the L4 on L5  foramen Additional comments:   The patient tolerated the procedures quite well.  The needles were removed.  Adequate hemostasis was established and vital signes were stable. The patient was taken to the recovery room in satisfactory condition where the patient was observed and subsequently discharged home.  Will follow up in the clinic in the 2 months.   Lance Bosch M.D.

## 2015-11-08 NOTE — Progress Notes (Signed)
   Subjective:    Patient ID: Gerald Harris, male    DOB: [birth year], 45 y.o.   MRN: [id number]  HPI    Review of Systems     Objective:   Physical Exam        Assessment & Plan:   This procedure was done under fluoroscopic guidance Fluoroscopic time was 1.5 minutes Number fluoroscopic frames were 2 MG Y was 36.4   Lance Bosch M.D.

## 2015-11-11 ENCOUNTER — Telehealth: Payer: Self-pay | Admitting: *Deleted

## 2015-11-11 NOTE — Telephone Encounter (Signed)
No problems post procedure. 

## 2015-11-14 DIAGNOSIS — F411 Generalized anxiety disorder: Secondary | ICD-10-CM | POA: Insufficient documentation

## 2015-12-13 ENCOUNTER — Telehealth: Payer: Self-pay | Admitting: Anesthesiology

## 2015-12-13 ENCOUNTER — Encounter: Payer: Self-pay | Admitting: Anesthesiology

## 2015-12-13 ENCOUNTER — Ambulatory Visit: Payer: BLUE CROSS/BLUE SHIELD | Attending: Anesthesiology | Admitting: Anesthesiology

## 2015-12-13 VITALS — BP 144/90 | HR 74 | Temp 98.5°F | Resp 16 | Ht 71.0 in | Wt 283.0 lb

## 2015-12-13 DIAGNOSIS — G8929 Other chronic pain: Secondary | ICD-10-CM | POA: Insufficient documentation

## 2015-12-13 DIAGNOSIS — M5417 Radiculopathy, lumbosacral region: Secondary | ICD-10-CM

## 2015-12-13 DIAGNOSIS — M5136 Other intervertebral disc degeneration, lumbar region: Secondary | ICD-10-CM

## 2015-12-13 DIAGNOSIS — R52 Pain, unspecified: Secondary | ICD-10-CM | POA: Diagnosis present

## 2015-12-13 DIAGNOSIS — Z9889 Other specified postprocedural states: Secondary | ICD-10-CM | POA: Diagnosis not present

## 2015-12-13 DIAGNOSIS — M5116 Intervertebral disc disorders with radiculopathy, lumbar region: Secondary | ICD-10-CM | POA: Insufficient documentation

## 2015-12-13 DIAGNOSIS — M545 Low back pain, unspecified: Secondary | ICD-10-CM

## 2015-12-13 MED ORDER — OXYCODONE-ACETAMINOPHEN 7.5-325 MG PO TABS: 1.0000 | ORAL_TABLET | Freq: Three times a day (TID) | ORAL | Status: DC | PRN

## 2015-12-13 NOTE — Progress Notes (Signed)
   Subjective:    Patient ID: ALEKSI LAVIGNA, male    DOB: [birth year], 45 y.o.   MRN: [id number]  HPI  Mr. Clinkscales returned to the clinic today indicating that he obtained 1-1/2 weeks of excellent pain relief He indicated at that time his subjective pain intensity rating was between 20 and 30% Shortly afterwards his wife had a miscarriage and he had to do much more physical activities than he was doing previously Accordingly he aggravated his back pain and today his subjective pain intensity rating is 95% Currently takes 1 Percocet 7.5/320 5 in the morning and tramadol 50 mg at night I will plan to change the schedule and put him on Percocet 7.5/325 every 8 hours when necessary I will discontinue the tramadol at this time and I will plan to perform his third and final bilateral transforaminal epidural steroid injection at L4-L5 for him At this point he is unable to perform his work related duties  Review of Systems  Constitutional: Negative.   HENT: Negative.   Eyes: Negative.   Respiratory: Negative.   Cardiovascular: Negative.   Gastrointestinal: Negative.   Endocrine: Negative.   Genitourinary: Negative.   Musculoskeletal: Positive for myalgias, back pain, joint swelling, arthralgias and gait problem. Negative for neck pain and neck stiffness.       This patient has chronic low back pain with lumbar degenerative disc disease associated with lumbar sacral radiculopathy. He is also had lumbar spine surgery in the past  Skin: Negative.   Neurological: Negative.   Hematological: Negative.   Psychiatric/Behavioral: Negative.        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 I spent 20 minutes discussing with him the need to be realistic about his  military-related related injuries and to discuss it with his fiance so that she understands his position His blood pressure is 144/90 mmHg His pulse is 74 bpm Equal and regular Heart Sounds 1 and 2 were heard in all areas There were no audible murmurs Temperature is 98.40F Respirations are 16 breaths per minute SPO2 was 100% Chest is clinically clear There are no adventitious sound Abdomen is soft and nontender There is no palpable organomegaly There is no significant lymphadenopathy Pupils are equal and reactive Cranial nerves are intact There are no new neurological no musculoskeletal findings  Nursing note and vitals reviewed.         Assessment & Plan:  Assessment 1 chronic low back pain 2 lumbar degenerative disc disease 3 lumbar radiculopathy 4 post lumbar spinal surgery   Plan of management 1 I spent about 20 minutes discussing the psychological and emotional and behavioral implications of his chronic back pain and how it affects his family with him and he appeared to benefit from this interaction 2 we'll discontinue  meloxicam 3 Will increase his Percocet 7.5/320 521 tablet every 8 hours when necessary and give him 63 tablets 4 Will plan his third and final bilateral lumbar transforaminal epidural steroid injection at L4 and L5 5 Will see him and do the procedure in 3 weeks for   Established patient    level Bul[place].D.

## 2015-12-13 NOTE — Telephone Encounter (Signed)
meds need prior auth before patient can get them filled, please call patient when he can pick up scripts

## 2015-12-13 NOTE — Progress Notes (Signed)
Safety precautions to be maintained throughout the outpatient stay will include: orient to surroundings, keep bed in low position, maintain call bell within reach at all times, provide assistance with transfer out of bed and ambulation.  

## 2015-12-13 NOTE — Telephone Encounter (Signed)
PA request sent.

## 2015-12-13 NOTE — Patient Instructions (Signed)
You were given a prescription or Percocet today. Epidural Steroid Injection Patient Information  Description: The epidural space surrounds the nerves as they exit the spinal cord.  In some patients, the nerves can be compressed and inflamed by a bulging disc or a tight spinal canal (spinal stenosis).  By injecting steroids into the epidural space, we can bring irritated nerves into direct contact with a potentially helpful medication.  These steroids act directly on the irritated nerves and can reduce swelling and inflammation which often leads to decreased pain.  Epidural steroids may be injected anywhere along the spine and from the neck to the low back depending upon the location of your pain.   After numbing the skin with local anesthetic (like Novocaine), a small needle is passed into the epidural space slowly.  You may experience a sensation of pressure while this is being done.  The entire block usually last less than 10 minutes.  Conditions which may be treated by epidural steroids:   Low back and leg pain  Neck and arm pain  Spinal stenosis  Post-laminectomy syndrome  Herpes zoster (shingles) pain  Pain from compression fractures  Preparation for the injection:  1. Do not eat any solid food or dairy products within 8 hours of your appointment.  2. You may drink clear liquids up to 3 hours before appointment.  Clear liquids include water, black coffee, juice or soda.  No milk or cream please. 3. You may take your regular medication, including pain medications, with a sip of water before your appointment  Diabetics should hold regular insulin (if taken separately) and take 1/2 normal NPH dos the morning of the procedure.  Carry some sugar containing items with you to your appointment. 4. A driver must accompany you and be prepared to drive you home after your procedure.  5. Bring all your current medications with your. 6. An IV may be inserted and sedation may be given at the  discretion of the physician.   7. A blood pressure cuff, EKG and other monitors will often be applied during the procedure.  Some patients may need to have extra oxygen administered for a short period. 8. You will be asked to provide medical information, including your allergies, prior to the procedure.  We must know immediately if you are taking blood thinners (like Coumadin/Warfarin)  Or if you are allergic to IV iodine contrast (dye). We must know if you could possible be pregnant.  Possible side-effects:  Bleeding from needle site  Infection (rare, may require surgery)  Nerve injury (rare)  Numbness & tingling (temporary)  Difficulty urinating (rare, temporary)  Spinal headache ( a headache worse with upright posture)  Light -headedness (temporary)  Pain at injection site (several days)  Decreased blood pressure (temporary)  Weakness in arm/leg (temporary)  Pressure sensation in back/neck (temporary)  Call if you experience:  Fever/chills associated with headache or increased back/neck pain.  Headache worsened by an upright position.  New onset weakness or numbness of an extremity below the injection site  Hives or difficulty breathing (go to the emergency room)  Inflammation or drainage at the infection site  Severe back/neck pain  Any new symptoms which are concerning to you  Please note:  Although the local anesthetic injected can often make your back or neck feel good for several hours after the injection, the pain will likely return.  It takes 3-7 days for steroids to work in the epidural space.  You may not notice any pain  relief for at least that one week.  If effective, we will often do a series of three injections spaced 3-6 weeks apart to maximally decrease your pain.  After the initial series, we generally will wait several months before considering a repeat injection of the same type.  If you have any questions, please call [phone] [facility]

## 2016-01-03 ENCOUNTER — Ambulatory Visit: Payer: BLUE CROSS/BLUE SHIELD | Attending: Anesthesiology | Admitting: Anesthesiology

## 2016-01-03 ENCOUNTER — Encounter: Payer: Self-pay | Admitting: Anesthesiology

## 2016-01-03 ENCOUNTER — Ambulatory Visit: Payer: BLUE CROSS/BLUE SHIELD | Admitting: Anesthesiology

## 2016-01-03 VITALS — BP 125/76 | HR 68 | Temp 97.5°F | Resp 18 | Ht 71.0 in | Wt 275.0 lb

## 2016-01-03 DIAGNOSIS — M5116 Intervertebral disc disorders with radiculopathy, lumbar region: Secondary | ICD-10-CM | POA: Insufficient documentation

## 2016-01-03 DIAGNOSIS — G8929 Other chronic pain: Secondary | ICD-10-CM | POA: Insufficient documentation

## 2016-01-03 DIAGNOSIS — M545 Low back pain, unspecified: Secondary | ICD-10-CM

## 2016-01-03 DIAGNOSIS — M5136 Other intervertebral disc degeneration, lumbar region: Secondary | ICD-10-CM

## 2016-01-03 DIAGNOSIS — M5417 Radiculopathy, lumbosacral region: Secondary | ICD-10-CM

## 2016-01-03 MED ORDER — MIDAZOLAM HCL 5 MG/5ML IJ SOLN
INTRAMUSCULAR | Status: AC
  Administered 2016-01-03: 2 mg via INTRAVENOUS
  Filled 2016-01-03: qty 5

## 2016-01-03 MED ORDER — BUPIVACAINE HCL (PF) 0.25 % IJ SOLN
INTRAMUSCULAR | Status: AC
  Administered 2016-01-03: 11:00:00
  Filled 2016-01-03: qty 30

## 2016-01-03 MED ORDER — BUPIVACAINE HCL (PF) 0.25 % IJ SOLN: 20.0000 mL | Freq: Once | INTRAMUSCULAR | Status: DC

## 2016-01-03 MED ORDER — IOPAMIDOL (ISOVUE-M 200) INJECTION 41%: 20.0000 mL | INTRAMUSCULAR | Status: DC | PRN

## 2016-01-03 MED ORDER — OXYCODONE-ACETAMINOPHEN 7.5-325 MG PO TABS: 1.0000 | ORAL_TABLET | Freq: Three times a day (TID) | ORAL | Status: DC | PRN

## 2016-01-03 MED ORDER — IOPAMIDOL (ISOVUE-M 200) INJECTION 41%
INTRAMUSCULAR | Status: AC
Start: 2016-01-03 — End: 2016-01-03
  Administered 2016-01-03: 11:00:00
  Filled 2016-01-03: qty 10

## 2016-01-03 MED ORDER — TRIAMCINOLONE ACETONIDE 40 MG/ML IJ SUSP
INTRAMUSCULAR | Status: AC
Start: 2016-01-03 — End: 2016-01-03
  Administered 2016-01-03: 80 mg
  Filled 2016-01-03: qty 2

## 2016-01-03 MED ORDER — FENTANYL CITRATE (PF) 100 MCG/2ML IJ SOLN
INTRAMUSCULAR | Status: AC
  Administered 2016-01-03: 100 ug via INTRAVENOUS
  Filled 2016-01-03: qty 2

## 2016-01-03 NOTE — Patient Instructions (Addendum)
Pain Management Discharge Instructions  General Discharge Instructions :  If you need to reach your doctor call: Monday-Friday 8:00 am - 4:00 pm at [phone] or toll free [phone].  After clinic hours [phone] to have operator reach doctor.  Bring all of your medication bottles to all your appointments in the pain clinic.  To cancel or reschedule your appointment with Pain Management please remember to call 24 hours in advance to avoid a fee.  Refer to the educational materials which you have been given on: General Risks, I had my Procedure. Discharge Instructions, Post Sedation.  Post Procedure Instructions:  The drugs you were given will stay in your system until tomorrow, so for the next 24 hours you should not drive, make any legal decisions or drink any alcoholic beverages.  You may eat anything you prefer, but it is better to start with liquids then soups and crackers, and gradually work up to solid foods.  Please notify your doctor immediately if you have any unusual bleeding, trouble breathing or pain that is not related to your normal pain.  Depending on the type of procedure that was done, some parts of your body may feel week and/or numb.  This usually clears up by tonight or the next day.  Walk with the use of an assistive device or accompanied by an adult for the 24 hours.  You may use ice on the affected area for the first 24 hours.  Put ice in a Ziploc bag and cover with a towel and place against area 15 minutes on 15 minutes off.  You may switch to heat after 24 hours.GENERAL RISKS AND COMPLICATIONS  What are the risk, side effects and possible complications? Generally speaking, most procedures are safe.  However, with any procedure there are risks, side effects, and the possibility of complications.  The risks and complications are dependent upon the sites that are lesioned, or the type of nerve block to be performed.  The closer the procedure is to the spine,  the more serious the risks are.  Great care is taken when placing the radio frequency needles, block needles or lesioning probes, but sometimes complications can occur. 1. Infection: Any time there is an injection through the skin, there is a risk of infection.  This is why sterile conditions are used for these blocks.  There are four possible types of infection. 1. Localized skin infection. 2. Central Nervous System Infection-This can be in the form of Meningitis, which can be deadly. 3. Epidural Infections-This can be in the form of an epidural abscess, which can cause pressure inside of the spine, causing compression of the spinal cord with subsequent paralysis. This would require an emergency surgery to decompress, and there are no guarantees that the patient would recover from the paralysis. 4. Discitis-This is an infection of the intervertebral discs.  It occurs in about 1% of discography procedures.  It is difficult to treat and it may lead to surgery.        2. Pain: the needles have to go through skin and soft tissues, will cause soreness.       3. Damage to internal structures:  The nerves to be lesioned may be near blood vessels or    other nerves which can be potentially damaged.       4. Bleeding: Bleeding is more common if the patient is taking blood thinners such as  aspirin, Coumadin, Ticiid, Plavix, etc., or if he/she have some genetic predisposition  such as   hemophilia. Bleeding into the spinal canal can cause compression of the spinal  cord with subsequent paralysis.  This would require an emergency surgery to  decompress and there are no guarantees that the patient would recover from the  paralysis.       5. Pneumothorax:  Puncturing of a lung is a possibility, every time a needle is introduced in  the area of the chest or upper back.  Pneumothorax refers to free air around the  collapsed lung(s), inside of the thoracic cavity (chest cavity).  Another two possible  complications  related to a similar event would include: Hemothorax and Chylothorax.   These are variations of the Pneumothorax, where instead of air around the collapsed  lung(s), you may have blood or chyle, respectively.       6. Spinal headaches: They may occur with any procedures in the area of the spine.       7. Persistent CSF (Cerebro-Spinal Fluid) leakage: This is a rare problem, but may occur  with prolonged intrathecal or epidural catheters either due to the formation of a fistulous  track or a dural tear.       8. Nerve damage: By working so close to the spinal cord, there is always a possibility of  nerve damage, which could be as serious as a permanent spinal cord injury with  paralysis.       9. Death:  Although rare, severe deadly allergic reactions known as "Anaphylactic  reaction" can occur to any of the medications used.      10. Worsening of the symptoms:  We can always make thing worse.  What are the chances of something like this happening? Chances of any of this occuring are extremely low.  By statistics, you have more of a chance of getting killed in a motor vehicle accident: while driving to the hospital than any of the above occurring .  Nevertheless, you should be aware that they are possibilities.  In general, it is similar to taking a shower.  Everybody knows that you can slip, hit your head and get killed.  Does that mean that you should not shower again?  Nevertheless always keep in mind that statistics do not mean anything if you happen to be on the wrong side of them.  Even if a procedure has a 1 (one) in a 1,000,000 (million) chance of going wrong, it you happen to be that one..Also, keep in mind that by statistics, you have more of a chance of having something go wrong when taking medications.  Who should not have this procedure? If you are on a blood thinning medication (e.g. Coumadin, Plavix, see list of "Blood Thinners"), or if you have an active infection going on, you should not  have the procedure.  If you are taking any blood thinners, please inform your physician.  How should I prepare for this procedure?  Do not eat or drink anything at least six hours prior to the procedure.  Bring a driver with you .  It cannot be a taxi.  Come accompanied by an adult that can drive you back, and that is strong enough to help you if your legs get weak or numb from the local anesthetic.  Take all of your medicines the morning of the procedure with just enough water to swallow them.  If you have diabetes, make sure that you are scheduled to have your procedure done first thing in the morning, whenever possible.  If you have diabetes,   take only half of your insulin dose and notify our nurse that you have done so as soon as you arrive at the clinic.  If you are diabetic, but only take blood sugar pills (oral hypoglycemic), then do not take them on the morning of your procedure.  You may take them after you have had the procedure.  Do not take aspirin or any aspirin-containing medications, at least eleven (11) days prior to the procedure.  They may prolong bleeding.  Wear loose fitting clothing that may be easy to take off and that you would not mind if it got stained with Betadine or blood.  Do not wear any jewelry or perfume  Remove any nail coloring.  It will interfere with some of our monitoring equipment.  NOTE: Remember that this is not meant to be interpreted as a complete list of all possible complications.  Unforeseen problems may occur.  BLOOD THINNERS The following drugs contain aspirin or other products, which can cause increased bleeding during surgery and should not be taken for 2 weeks prior to and 1 week after surgery.  If you should need take something for relief of minor pain, you may take acetaminophen which is found in Tylenol,m Datril, Anacin-3 and Panadol. It is not blood thinner. The products listed below are.  Do not take any of the products listed below  in addition to any listed on your instruction sheet.  A.P.C or A.P.C with Codeine Codeine Phosphate Capsules #3 Ibuprofen Ridaura  ABC compound Congesprin Imuran rimadil  Advil Cope Indocin Robaxisal  Alka-Seltzer Effervescent Pain Reliever and Antacid Coricidin or Coricidin-D  Indomethacin Rufen  Alka-Seltzer plus Cold Medicine Cosprin Ketoprofen S-A-C Tablets  Anacin Analgesic Tablets or Capsules Coumadin Korlgesic Salflex  Anacin Extra Strength Analgesic tablets or capsules CP-2 Tablets Lanoril Salicylate  Anaprox Cuprimine Capsules Levenox Salocol  Anexsia-D Dalteparin Magan Salsalate  Anodynos Darvon compound Magnesium Salicylate Sine-off  Ansaid Dasin Capsules Magsal Sodium Salicylate  Anturane Depen Capsules Marnal Soma  APF Arthritis pain formula Dewitt's Pills Measurin Stanback  Argesic Dia-Gesic Meclofenamic Sulfinpyrazone  Arthritis Bayer Timed Release Aspirin Diclofenac Meclomen Sulindac  Arthritis pain formula Anacin Dicumarol Medipren Supac  Analgesic (Safety coated) Arthralgen Diffunasal Mefanamic Suprofen  Arthritis Strength Bufferin Dihydrocodeine Mepro Compound Suprol  Arthropan liquid Dopirydamole Methcarbomol with Aspirin Synalgos  ASA tablets/Enseals Disalcid Micrainin Tagament  Ascriptin Doan's Midol Talwin  Ascriptin A/D Dolene Mobidin Tanderil  Ascriptin Extra Strength Dolobid Moblgesic Ticlid  Ascriptin with Codeine Doloprin or Doloprin with Codeine Momentum Tolectin  Asperbuf Duoprin Mono-gesic Trendar  Aspergum Duradyne Motrin or Motrin IB Triminicin  Aspirin plain, buffered or enteric coated Durasal Myochrisine Trigesic  Aspirin Suppositories Easprin Nalfon Trillsate  Aspirin with Codeine Ecotrin Regular or Extra Strength Naprosyn Uracel  Atromid-S Efficin Naproxen Ursinus  Auranofin Capsules Elmiron Neocylate Vanquish  Axotal Emagrin Norgesic Verin  Azathioprine Empirin or Empirin with Codeine Normiflo Vitamin E  Azolid Emprazil Nuprin Voltaren  Bayer  Aspirin plain, buffered or children's or timed BC Tablets or powders Encaprin Orgaran Warfarin Sodium  Buff-a-Comp Enoxaparin Orudis Zorpin  Buff-a-Comp with Codeine Equegesic Os-Cal-Gesic   Buffaprin Excedrin plain, buffered or Extra Strength Oxalid   Bufferin Arthritis Strength Feldene Oxphenbutazone   Bufferin plain or Extra Strength Feldene Capsules Oxycodone with Aspirin   Bufferin with Codeine Fenoprofen Fenoprofen Pabalate or Pabalate-SF   Buffets II Flogesic Panagesic   Buffinol plain or Extra Strength Florinal or Florinal with Codeine Panwarfarin   Buf-Tabs Flurbiprofen Penicillamine   Butalbital Compound Four-way cold tablets   Penicillin   Butazolidin Fragmin Pepto-Bismol   Carbenicillin Geminisyn Percodan   Carna Arthritis Reliever Geopen Persantine   Carprofen Gold's salt Persistin   Chloramphenicol Goody's Phenylbutazone   Chloromycetin Haltrain Piroxlcam   Clmetidine heparin Plaquenil   Cllnoril Hyco-pap Ponstel   Clofibrate Hydroxy chloroquine Propoxyphen         Before stopping any of these medications, be sure to consult the physician who ordered them.  Some, such as Coumadin (Warfarin) are ordered to prevent or treat serious conditions such as "deep thrombosis", "pumonary embolisms", and other heart problems.  The amount of time that you may need off of the medication may also vary with the medication and the reason for which you were taking it.  If you are taking any of these medications, please make sure you notify your pain physician before you undergo any procedures.         Pain Management Discharge Instructions  General Discharge Instructions :  If you need to reach your doctor call: Monday-Friday 8:00 am - 4:00 pm at [phone] or toll free [phone].  After clinic hours [phone] to have operator reach doctor.  Bring all of your medication bottles to all your appointments in the pain clinic.  To cancel or reschedule your appointment with Pain  Management please remember to call 24 hours in advance to avoid a fee.  Refer to the educational materials which you have been given on: General Risks, I had my Procedure. Discharge Instructions, Post Sedation.  Post Procedure Instructions:  The drugs you were given will stay in your system until tomorrow, so for the next 24 hours you should not drive, make any legal decisions or drink any alcoholic beverages.  You may eat anything you prefer, but it is better to start with liquids then soups and crackers, and gradually work up to solid foods.  Please notify your doctor immediately if you have any unusual bleeding, trouble breathing or pain that is not related to your normal pain.  Depending on the type of procedure that was done, some parts of your body may feel week and/or numb.  This usually clears up by tonight or the next day.  Walk with the use of an assistive device or accompanied by an adult for the 24 hours.  You may use ice on the affected area for the first 24 hours.  Put ice in a Ziploc bag and cover with a towel and place against area 15 minutes on 15 minutes off.  You may switch to heat after 24 hours.GENERAL RISKS AND COMPLICATIONS  What are the risk, side effects and possible complications? Generally speaking, most procedures are safe.  However, with any procedure there are risks, side effects, and the possibility of complications.  The risks and complications are dependent upon the sites that are lesioned, or the type of nerve block to be performed.  The closer the procedure is to the spine, the more serious the risks are.  Great care is taken when placing the radio frequency needles, block needles or lesioning probes, but sometimes complications can occur. 2. Infection: Any time there is an injection through the skin, there is a risk of infection.  This is why sterile conditions are used for these blocks.  There are four possible types of infection. 1. Localized skin  infection. 2. Central Nervous System Infection-This can be in the form of Meningitis, which can be deadly. 3. Epidural Infections-This can be in the form of an epidural abscess, which can cause pressure  inside of the spine, causing compression of the spinal cord with subsequent paralysis. This would require an emergency surgery to decompress, and there are no guarantees that the patient would recover from the paralysis. 4. Discitis-This is an infection of the intervertebral discs.  It occurs in about 1% of discography procedures.  It is difficult to treat and it may lead to surgery.        2. Pain: the needles have to go through skin and soft tissues, will cause soreness.       3. Damage to internal structures:  The nerves to be lesioned may be near blood vessels or    other nerves which can be potentially damaged.       4. Bleeding: Bleeding is more common if the patient is taking blood thinners such as  aspirin, Coumadin, Ticiid, Plavix, etc., or if he/she have some genetic predisposition  such as hemophilia. Bleeding into the spinal canal can cause compression of the spinal  cord with subsequent paralysis.  This would require an emergency surgery to  decompress and there are no guarantees that the patient would recover from the  paralysis.       5. Pneumothorax:  Puncturing of a lung is a possibility, every time a needle is introduced in  the area of the chest or upper back.  Pneumothorax refers to free air around the  collapsed lung(s), inside of the thoracic cavity (chest cavity).  Another two possible  complications related to a similar event would include: Hemothorax and Chylothorax.   These are variations of the Pneumothorax, where instead of air around the collapsed  lung(s), you may have blood or chyle, respectively.       6. Spinal headaches: They may occur with any procedures in the area of the spine.       7. Persistent CSF (Cerebro-Spinal Fluid) leakage: This is a rare problem, but may occur   with prolonged intrathecal or epidural catheters either due to the formation of a fistulous  track or a dural tear.       8. Nerve damage: By working so close to the spinal cord, there is always a possibility of  nerve damage, which could be as serious as a permanent spinal cord injury with  paralysis.       9. Death:  Although rare, severe deadly allergic reactions known as "Anaphylactic  reaction" can occur to any of the medications used.      10. Worsening of the symptoms:  We can always make thing worse.  What are the chances of something like this happening? Chances of any of this occuring are extremely low.  By statistics, you have more of a chance of getting killed in a motor vehicle accident: while driving to the hospital than any of the above occurring .  Nevertheless, you should be aware that they are possibilities.  In general, it is similar to taking a shower.  Everybody knows that you can slip, hit your head and get killed.  Does that mean that you should not shower again?  Nevertheless always keep in mind that statistics do not mean anything if you happen to be on the wrong side of them.  Even if a procedure has a 1 (one) in a 1,000,000 (million) chance of going wrong, it you happen to be that one..Also, keep in mind that by statistics, you have more of a chance of having something go wrong when taking medications.  Who should not have this procedure?  If you are on a blood thinning medication (e.g. Coumadin, Plavix, see list of "Blood Thinners"), or if you have an active infection going on, you should not have the procedure.  If you are taking any blood thinners, please inform your physician.  How should I prepare for this procedure?  Do not eat or drink anything at least six hours prior to the procedure.  Bring a driver with you .  It cannot be a taxi.  Come accompanied by an adult that can drive you back, and that is strong enough to help you if your legs get weak or numb from the  local anesthetic.  Take all of your medicines the morning of the procedure with just enough water to swallow them.  If you have diabetes, make sure that you are scheduled to have your procedure done first thing in the morning, whenever possible.  If you have diabetes, take only half of your insulin dose and notify our nurse that you have done so as soon as you arrive at the clinic.  If you are diabetic, but only take blood sugar pills (oral hypoglycemic), then do not take them on the morning of your procedure.  You may take them after you have had the procedure.  Do not take aspirin or any aspirin-containing medications, at least eleven (11) days prior to the procedure.  They may prolong bleeding.  Wear loose fitting clothing that may be easy to take off and that you would not mind if it got stained with Betadine or blood.  Do not wear any jewelry or perfume  Remove any nail coloring.  It will interfere with some of our monitoring equipment.  NOTE: Remember that this is not meant to be interpreted as a complete list of all possible complications.  Unforeseen problems may occur.  BLOOD THINNERS The following drugs contain aspirin or other products, which can cause increased bleeding during surgery and should not be taken for 2 weeks prior to and 1 week after surgery.  If you should need take something for relief of minor pain, you may take acetaminophen which is found in Tylenol,m Datril, Anacin-3 and Panadol. It is not blood thinner. The products listed below are.  Do not take any of the products listed below in addition to any listed on your instruction sheet.  A.P.C or A.P.C with Codeine Codeine Phosphate Capsules #3 Ibuprofen Ridaura  ABC compound Congesprin Imuran rimadil  Advil Cope Indocin Robaxisal  Alka-Seltzer Effervescent Pain Reliever and Antacid Coricidin or Coricidin-D  Indomethacin Rufen  Alka-Seltzer plus Cold Medicine Cosprin Ketoprofen S-A-C Tablets  Anacin Analgesic  Tablets or Capsules Coumadin Korlgesic Salflex  Anacin Extra Strength Analgesic tablets or capsules CP-2 Tablets Lanoril Salicylate  Anaprox Cuprimine Capsules Levenox Salocol  Anexsia-D Dalteparin Magan Salsalate  Anodynos Darvon compound Magnesium Salicylate Sine-off  Ansaid Dasin Capsules Magsal Sodium Salicylate  Anturane Depen Capsules Marnal Soma  APF Arthritis pain formula Dewitt's Pills Measurin Stanback  Argesic Dia-Gesic Meclofenamic Sulfinpyrazone  Arthritis Bayer Timed Release Aspirin Diclofenac Meclomen Sulindac  Arthritis pain formula Anacin Dicumarol Medipren Supac  Analgesic (Safety coated) Arthralgen Diffunasal Mefanamic Suprofen  Arthritis Strength Bufferin Dihydrocodeine Mepro Compound Suprol  Arthropan liquid Dopirydamole Methcarbomol with Aspirin Synalgos  ASA tablets/Enseals Disalcid Micrainin Tagament  Ascriptin Doan's Midol Talwin  Ascriptin A/D Dolene Mobidin Tanderil  Ascriptin Extra Strength Dolobid Moblgesic Ticlid  Ascriptin with Codeine Doloprin or Doloprin with Codeine Momentum Tolectin  Asperbuf Duoprin Mono-gesic Trendar  Aspergum Duradyne Motrin or Motrin IB Triminicin  Aspirin plain, buffered or enteric coated Durasal Myochrisine Trigesic  Aspirin Suppositories Easprin Nalfon Trillsate  Aspirin with Codeine Ecotrin Regular or Extra Strength Naprosyn Uracel  Atromid-S Efficin Naproxen Ursinus  Auranofin Capsules Elmiron Neocylate Vanquish  Axotal Emagrin Norgesic Verin  Azathioprine Empirin or Empirin with Codeine Normiflo Vitamin E  Azolid Emprazil Nuprin Voltaren  Bayer Aspirin plain, buffered or children's or timed BC Tablets or powders Encaprin Orgaran Warfarin Sodium  Buff-a-Comp Enoxaparin Orudis Zorpin  Buff-a-Comp with Codeine Equegesic Os-Cal-Gesic   Buffaprin Excedrin plain, buffered or Extra Strength Oxalid   Bufferin Arthritis Strength Feldene Oxphenbutazone   Bufferin plain or Extra Strength Feldene Capsules Oxycodone with Aspirin    Bufferin with Codeine Fenoprofen Fenoprofen Pabalate or Pabalate-SF   Buffets II Flogesic Panagesic   Buffinol plain or Extra Strength Florinal or Florinal with Codeine Panwarfarin   Buf-Tabs Flurbiprofen Penicillamine   Butalbital Compound Four-way cold tablets Penicillin   Butazolidin Fragmin Pepto-Bismol   Carbenicillin Geminisyn Percodan   Carna Arthritis Reliever Geopen Persantine   Carprofen Gold's salt Persistin   Chloramphenicol Goody's Phenylbutazone   Chloromycetin Haltrain Piroxlcam   Clmetidine heparin Plaquenil   Cllnoril Hyco-pap Ponstel   Clofibrate Hydroxy chloroquine Propoxyphen         Before stopping any of these medications, be sure to consult the physician who ordered them.  Some, such as Coumadin (Warfarin) are ordered to prevent or treat serious conditions such as "deep thrombosis", "pumonary embolisms", and other heart problems.  The amount of time that you may need off of the medication may also vary with the medication and the reason for which you were taking it.  If you are taking any of these medications, please make sure you notify your pain physician before you undergo any procedures.         Pain Management Discharge Instructions  General Discharge Instructions :  If you need to reach your doctor call: Monday-Friday 8:00 am - 4:00 pm at [phone] or toll free [phone].  After clinic hours [phone] to have operator reach doctor.  Bring all of your medication bottles to all your appointments in the pain clinic.  To cancel or reschedule your appointment with Pain Management please remember to call 24 hours in advance to avoid a fee.  Refer to the educational materials which you have been given on: General Risks, I had my Procedure. Discharge Instructions, Post Sedation.  Post Procedure Instructions:  The drugs you were given will stay in your system until tomorrow, so for the next 24 hours you should not drive, make any legal decisions  or drink any alcoholic beverages.  You may eat anything you prefer, but it is better to start with liquids then soups and crackers, and gradually work up to solid foods.  Please notify your doctor immediately if you have any unusual bleeding, trouble breathing or pain that is not related to your normal pain.  Depending on the type of procedure that was done, some parts of your body may feel week and/or numb.  This usually clears up by tonight or the next day.  Walk with the use of an assistive device or accompanied by an adult for the 24 hours.  You may use ice on the affected area for the first 24 hours.  Put ice in a Ziploc bag and cover with a towel and place against area 15 minutes on 15 minutes off.  You may switch to heat after 24 hours.GENERAL RISKS AND COMPLICATIONS  What are the risk, side  effects and possible complications? Generally speaking, most procedures are safe.  However, with any procedure there are risks, side effects, and the possibility of complications.  The risks and complications are dependent upon the sites that are lesioned, or the type of nerve block to be performed.  The closer the procedure is to the spine, the more serious the risks are.  Great care is taken when placing the radio frequency needles, block needles or lesioning probes, but sometimes complications can occur. 3. Infection: Any time there is an injection through the skin, there is a risk of infection.  This is why sterile conditions are used for these blocks.  There are four possible types of infection. 1. Localized skin infection. 2. Central Nervous System Infection-This can be in the form of Meningitis, which can be deadly. 3. Epidural Infections-This can be in the form of an epidural abscess, which can cause pressure inside of the spine, causing compression of the spinal cord with subsequent paralysis. This would require an emergency surgery to decompress, and there are no guarantees that the patient  would recover from the paralysis. 4. Discitis-This is an infection of the intervertebral discs.  It occurs in about 1% of discography procedures.  It is difficult to treat and it may lead to surgery.        2. Pain: the needles have to go through skin and soft tissues, will cause soreness.       3. Damage to internal structures:  The nerves to be lesioned may be near blood vessels or    other nerves which can be potentially damaged.       4. Bleeding: Bleeding is more common if the patient is taking blood thinners such as  aspirin, Coumadin, Ticiid, Plavix, etc., or if he/she have some genetic predisposition  such as hemophilia. Bleeding into the spinal canal can cause compression of the spinal  cord with subsequent paralysis.  This would require an emergency surgery to  decompress and there are no guarantees that the patient would recover from the  paralysis.       5. Pneumothorax:  Puncturing of a lung is a possibility, every time a needle is introduced in  the area of the chest or upper back.  Pneumothorax refers to free air around the  collapsed lung(s), inside of the thoracic cavity (chest cavity).  Another two possible  complications related to a similar event would include: Hemothorax and Chylothorax.   These are variations of the Pneumothorax, where instead of air around the collapsed  lung(s), you may have blood or chyle, respectively.       6. Spinal headaches: They may occur with any procedures in the area of the spine.       7. Persistent CSF (Cerebro-Spinal Fluid) leakage: This is a rare problem, but may occur  with prolonged intrathecal or epidural catheters either due to the formation of a fistulous  track or a dural tear.       8. Nerve damage: By working so close to the spinal cord, there is always a possibility of  nerve damage, which could be as serious as a permanent spinal cord injury with  paralysis.       9. Death:  Although rare, severe deadly allergic reactions known as  "Anaphylactic  reaction" can occur to any of the medications used.      10. Worsening of the symptoms:  We can always make thing worse.  What are the chances of something like this  happening? Chances of any of this occuring are extremely low.  By statistics, you have more of a chance of getting killed in a motor vehicle accident: while driving to the hospital than any of the above occurring .  Nevertheless, you should be aware that they are possibilities.  In general, it is similar to taking a shower.  Everybody knows that you can slip, hit your head and get killed.  Does that mean that you should not shower again?  Nevertheless always keep in mind that statistics do not mean anything if you happen to be on the wrong side of them.  Even if a procedure has a 1 (one) in a 1,000,000 (million) chance of going wrong, it you happen to be that one..Also, keep in mind that by statistics, you have more of a chance of having something go wrong when taking medications.  Who should not have this procedure? If you are on a blood thinning medication (e.g. Coumadin, Plavix, see list of "Blood Thinners"), or if you have an active infection going on, you should not have the procedure.  If you are taking any blood thinners, please inform your physician.  How should I prepare for this procedure?  Do not eat or drink anything at least six hours prior to the procedure.  Bring a driver with you .  It cannot be a taxi.  Come accompanied by an adult that can drive you back, and that is strong enough to help you if your legs get weak or numb from the local anesthetic.  Take all of your medicines the morning of the procedure with just enough water to swallow them.  If you have diabetes, make sure that you are scheduled to have your procedure done first thing in the morning, whenever possible.  If you have diabetes, take only half of your insulin dose and notify our nurse that you have done so as soon as you arrive at the  clinic.  If you are diabetic, but only take blood sugar pills (oral hypoglycemic), then do not take them on the morning of your procedure.  You may take them after you have had the procedure.  Do not take aspirin or any aspirin-containing medications, at least eleven (11) days prior to the procedure.  They may prolong bleeding.  Wear loose fitting clothing that may be easy to take off and that you would not mind if it got stained with Betadine or blood.  Do not wear any jewelry or perfume  Remove any nail coloring.  It will interfere with some of our monitoring equipment.  NOTE: Remember that this is not meant to be interpreted as a complete list of all possible complications.  Unforeseen problems may occur.  BLOOD THINNERS The following drugs contain aspirin or other products, which can cause increased bleeding during surgery and should not be taken for 2 weeks prior to and 1 week after surgery.  If you should need take something for relief of minor pain, you may take acetaminophen which is found in Tylenol,m Datril, Anacin-3 and Panadol. It is not blood thinner. The products listed below are.  Do not take any of the products listed below in addition to any listed on your instruction sheet.  A.P.C or A.P.C with Codeine Codeine Phosphate Capsules #3 Ibuprofen Ridaura  ABC compound Congesprin Imuran rimadil  Advil Cope Indocin Robaxisal  Alka-Seltzer Effervescent Pain Reliever and Antacid Coricidin or Coricidin-D  Indomethacin Rufen  Alka-Seltzer plus Cold Medicine Cosprin Ketoprofen S-A-C Tablets  Anacin Analgesic Tablets or Capsules Coumadin Korlgesic Salflex  Anacin Extra Strength Analgesic tablets or capsules CP-2 Tablets Lanoril Salicylate  Anaprox Cuprimine Capsules Levenox Salocol  Anexsia-D Dalteparin Magan Salsalate  Anodynos Darvon compound Magnesium Salicylate Sine-off  Ansaid Dasin Capsules Magsal Sodium Salicylate  Anturane Depen Capsules Marnal Soma  APF Arthritis pain  formula Dewitt's Pills Measurin Stanback  Argesic Dia-Gesic Meclofenamic Sulfinpyrazone  Arthritis Bayer Timed Release Aspirin Diclofenac Meclomen Sulindac  Arthritis pain formula Anacin Dicumarol Medipren Supac  Analgesic (Safety coated) Arthralgen Diffunasal Mefanamic Suprofen  Arthritis Strength Bufferin Dihydrocodeine Mepro Compound Suprol  Arthropan liquid Dopirydamole Methcarbomol with Aspirin Synalgos  ASA tablets/Enseals Disalcid Micrainin Tagament  Ascriptin Doan's Midol Talwin  Ascriptin A/D Dolene Mobidin Tanderil  Ascriptin Extra Strength Dolobid Moblgesic Ticlid  Ascriptin with Codeine Doloprin or Doloprin with Codeine Momentum Tolectin  Asperbuf Duoprin Mono-gesic Trendar  Aspergum Duradyne Motrin or Motrin IB Triminicin  Aspirin plain, buffered or enteric coated Durasal Myochrisine Trigesic  Aspirin Suppositories Easprin Nalfon Trillsate  Aspirin with Codeine Ecotrin Regular or Extra Strength Naprosyn Uracel  Atromid-S Efficin Naproxen Ursinus  Auranofin Capsules Elmiron Neocylate Vanquish  Axotal Emagrin Norgesic Verin  Azathioprine Empirin or Empirin with Codeine Normiflo Vitamin E  Azolid Emprazil Nuprin Voltaren  Bayer Aspirin plain, buffered or children's or timed BC Tablets or powders Encaprin Orgaran Warfarin Sodium  Buff-a-Comp Enoxaparin Orudis Zorpin  Buff-a-Comp with Codeine Equegesic Os-Cal-Gesic   Buffaprin Excedrin plain, buffered or Extra Strength Oxalid   Bufferin Arthritis Strength Feldene Oxphenbutazone   Bufferin plain or Extra Strength Feldene Capsules Oxycodone with Aspirin   Bufferin with Codeine Fenoprofen Fenoprofen Pabalate or Pabalate-SF   Buffets II Flogesic Panagesic   Buffinol plain or Extra Strength Florinal or Florinal with Codeine Panwarfarin   Buf-Tabs Flurbiprofen Penicillamine   Butalbital Compound Four-way cold tablets Penicillin   Butazolidin Fragmin Pepto-Bismol   Carbenicillin Geminisyn Percodan   Carna Arthritis Reliever Geopen  Persantine   Carprofen Gold's salt Persistin   Chloramphenicol Goody's Phenylbutazone   Chloromycetin Haltrain Piroxlcam   Clmetidine heparin Plaquenil   Cllnoril Hyco-pap Ponstel   Clofibrate Hydroxy chloroquine Propoxyphen         Before stopping any of these medications, be sure to consult the physician who ordered them.  Some, such as Coumadin (Warfarin) are ordered to prevent or treat serious conditions such as "deep thrombosis", "pumonary embolisms", and other heart problems.  The amount of time that you may need off of the medication may also vary with the medication and the reason for which you were taking it.  If you are taking any of these medications, please make sure you notify your pain physician before you undergo any procedures.

## 2016-01-03 NOTE — Progress Notes (Signed)
Safety precautions to be maintained throughout the outpatient stay will include: orient to surroundings, keep bed in low position, maintain call bell within reach at all times, provide assistance with transfer out of bed and ambulation.  

## 2016-01-03 NOTE — Procedures (Signed)
Date of procedure:  01/03/2016  Preoperative Diagnosis:  1 chronic low back pain 2 lumbar degenerative disc disease He lumbar radiculopathy  Postoperative Diagnosis:  Same.  Procedure 1. Bilateral Lumbar transforaminal epidural steroid injection at L4 and L5 2. Epidurogram with interpretation. 3. Fluoroscopic guidance.   Surgeon: Lance Bosch, MD  Anesthesia: MAC anesthesia by the nurse and staff under my direction  Informed consent was obtained and the patient appeared to accept and understand the benefits and risks of this procedure.   Pre procedure comments:  None  Description of the Procedure:  The patient was taken to the operating room and placed in the prone position.  Intravenous sedation and MAC anesthesia was administered by the nurse and staff under my direction.  After appropriate sedation, the lumbosacral area was prepped with Betadine.  Then the patient was fully draped.  Fluoroscopic view of the lumbar spine was done and the pedicle on the right side was visualized.  The skin over the 6 o'clock position of the pedicle on the right was infiltrated with 3 cc of 1% Lidocaine using a 25 gauge needle.  Adequate visualization of pedicle was obtained by using an oblique fluoroscopic view.  Then a 22 gauge 3 1/2 spinal needle was introduced through the skin and directed towards the 6 o'clock position of the pedicles at L4 and L5.  The needles were advanced into the superior medical portion of the foramen at L4 and L5.  Then a lateral view was utilized to ensure optimal needle position and depth in the foramen.    Epidurogram with Interpretation:  After negative aspiration, each needle was injected with 1 cc of Omnipaque 300 in the performance of independent epidurograms for the purposes of detailing the epidural anatomy along with nerve root anatomy at the corresponding levels.  Further, the visualization of the contrast media was also to ensure adequate spread of he  injectate into the presumed affected areas.  No intravascular or intrathecal spread was observed at any level and there were no corresponding paresthesias upon injection.  Epidurograms were visualized along with spread of contrast media into the exiting nerve root.  Transformaraminal Epidural Steroid Injection:  Again aspirations were negative for blood, CSF or other body fluids.  Then 1.5 cc of 0.25%Bupivacaine and 20 mg of Kenalog was injected in the L4 and L5 foramen.  After appropriate sedation, the lumbosacral area was prepped with Betadine.  Then the patient was fully draped.  Fluoroscopic view of the lumbar spine was done and the pedicle on the left side was visualized.  The skin over the 6 o'clock position of the pedicle on the left was infiltrated with 3 cc of 1% Lidocaine using a 25 gauge needle.  Adequate visualization of pedicle was obtained by using an oblique fluoroscopic view.  Then a 22 gauge 3 1/2 spinal needle was introduced through the skin and directed towards the 6 o'clock position of the pedicles at L4 and L5.  The needles were advanced into the superior medical portion of the foramen at L4 and L5.  Then a lateral view was utilized to ensure optimal needle position and depth in the foramen.    Epidurogram with Interpretation:  After negative aspiration, each needle was injected with 1 cc of Omnipaque 200 in the performance of independent epidurograms for the purposes of detailing the epidural anatomy along with nerve root anatomy at the corresponding levels.  Further, the visualization of the contrast media was also to ensure adequate spread of he injectate  into the presumed affected areas.  No intravascular or intrathecal spread was observed at any level and there were no corresponding paresthesias upon injection.  Epidurograms were visualized along with spread of contrast media into the exiting nerve root.  Transformaraminal Epidural Steroid Injection:  Again aspirations were  negative for blood, CSF or other body fluids.  Then 1.5 cc of 0.25%Bupivacaine and 20 mg of Kenalog was injected in the L4 on L5 foramen.  Additional comments:  This procedure was done using fluoroscopic guidance Number of fluoroscopic frames were 8 views Fluoroscopic time was 1.9 minutes MG Y was 46.4  The patient tolerated the procedures quite well.  The needles were removed.  Adequate hemostasis was established and vital signes were stable. The patient was taken to the recovery room in satisfactory condition where the patient was observed and subsequently discharged home.   Patient was given Percocet 7.5/325 one tablet 3 times a day when necessary for pain and was given 63 tablets Will follow up in the clinic in the next 3 weeks  [place].D.

## 2016-01-06 ENCOUNTER — Telehealth: Payer: Self-pay | Admitting: *Deleted

## 2016-01-06 NOTE — Telephone Encounter (Signed)
Voicemail left for patient to call our office if there are questions or concerns from procedure on Friday.

## 2016-01-31 ENCOUNTER — Ambulatory Visit: Payer: BLUE CROSS/BLUE SHIELD | Attending: Anesthesiology | Admitting: Anesthesiology

## 2016-01-31 ENCOUNTER — Encounter: Payer: Self-pay | Admitting: Anesthesiology

## 2016-01-31 VITALS — BP 109/60 | HR 102 | Temp 98.3°F | Resp 18 | Ht 71.0 in | Wt 275.0 lb

## 2016-01-31 DIAGNOSIS — M5116 Intervertebral disc disorders with radiculopathy, lumbar region: Secondary | ICD-10-CM | POA: Diagnosis not present

## 2016-01-31 DIAGNOSIS — M545 Low back pain, unspecified: Secondary | ICD-10-CM

## 2016-01-31 DIAGNOSIS — G8929 Other chronic pain: Secondary | ICD-10-CM | POA: Diagnosis present

## 2016-01-31 DIAGNOSIS — M791 Myalgia: Secondary | ICD-10-CM | POA: Insufficient documentation

## 2016-01-31 DIAGNOSIS — M5136 Other intervertebral disc degeneration, lumbar region: Secondary | ICD-10-CM

## 2016-01-31 DIAGNOSIS — Z9889 Other specified postprocedural states: Secondary | ICD-10-CM

## 2016-01-31 DIAGNOSIS — M5410 Radiculopathy, site unspecified: Secondary | ICD-10-CM

## 2016-01-31 DIAGNOSIS — M51369 Other intervertebral disc degeneration, lumbar region without mention of lumbar back pain or lower extremity pain: Secondary | ICD-10-CM

## 2016-01-31 DIAGNOSIS — M5417 Radiculopathy, lumbosacral region: Secondary | ICD-10-CM

## 2016-01-31 HISTORY — DX: Radiculopathy, site unspecified: M54.10

## 2016-01-31 MED ORDER — MELOXICAM 7.5 MG PO TABS: 7.5000 mg | ORAL_TABLET | Freq: Every day | ORAL | Status: DC

## 2016-01-31 MED ORDER — MELOXICAM 7.5 MG PO TABS: 7.5000 mg | ORAL_TABLET | Freq: Two times a day (BID) | ORAL | Status: DC

## 2016-01-31 MED ORDER — OXYCODONE-ACETAMINOPHEN 7.5-325 MG PO TABS: 1.0000 | ORAL_TABLET | Freq: Two times a day (BID) | ORAL | Status: DC

## 2016-01-31 NOTE — Patient Instructions (Signed)
You were given a prescription for Percocet and Mobic today.

## 2016-01-31 NOTE — Progress Notes (Signed)
Safety precautions to be maintained throughout the outpatient stay will include: orient to surroundings, keep bed in low position, maintain call bell within reach at all times, provide assistance with transfer out of bed and ambulation.  

## 2016-02-01 NOTE — Progress Notes (Signed)
   Subjective:    Patient ID: Gerald Harris, male    DOB: 1971/03/30, 45 y.o.   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   HENT: Negative.   Eyes: Negative.   Respiratory: Negative.   Cardiovascular: Negative.   Gastrointestinal: Negative.   Endocrine: Negative.   Genitourinary: Negative.   Musculoskeletal: Positive for myalgias, back pain, arthralgias and gait problem. Negative for joint swelling, neck pain and neck stiffness.  Skin: Negative.   Allergic/Immunologic: Negative.   Neurological: Negative.   Hematological: Negative.   Psychiatric/Behavioral: Negative.        Objective:   Physical Exam  Cardiovascular:  Patient appears to be in moderate discomfort and distress His subjective pain intensity rating is 80% His blood pressure is 109/60 mmHg His pulse is 102 bpm His respirations are 18 breaths per min Is SPO2 was 100% His temperature was 98.24F His weight was 275 pounds There was tenderness in his low back area at the level of L3 L4 L5 and his range of motion was moderately decreased There were no other new neurological nor musculoskeletal ffindings  Nursing note and vitals reviewed.         Assessment & Plan:   Assessment  1 chronic low back pain  2 lumbar degenerative disc disease 3 lumbar radiculopathy   Plan of  management I spent about 30 minutes talking with  Pain management and the role of opioids and the potential complications that could result from increase in opioid dose levels. He had many questions and I tried to provide answers on the issue  Dosing for chronic pain and the need to avoid the undesirable ide effects of opioid medication including dependency addiction and tolerance We'll continue him on Percocet 7.5/325 mg BID  # 42 tablets We'll begin him on meloxicam 7.5 mg twice a day # 42 tablets Would recommend the use of ice packs to the back every 3 hours We'll follow-up with him in 3 weeks   Established patient      [place] M.D.

## 2016-02-21 ENCOUNTER — Ambulatory Visit: Payer: BLUE CROSS/BLUE SHIELD | Attending: Pain Medicine | Admitting: Pain Medicine

## 2016-02-21 ENCOUNTER — Encounter: Payer: Self-pay | Admitting: Pain Medicine

## 2016-02-21 ENCOUNTER — Ambulatory Visit: Payer: BLUE CROSS/BLUE SHIELD | Admitting: Anesthesiology

## 2016-02-21 VITALS — BP 123/85 | HR 82 | Temp 97.8°F | Resp 16 | Ht 71.0 in | Wt 275.0 lb

## 2016-02-21 DIAGNOSIS — M2241 Chondromalacia patellae, right knee: Secondary | ICD-10-CM

## 2016-02-21 DIAGNOSIS — Z87891 Personal history of nicotine dependence: Secondary | ICD-10-CM | POA: Insufficient documentation

## 2016-02-21 DIAGNOSIS — N200 Calculus of kidney: Secondary | ICD-10-CM | POA: Insufficient documentation

## 2016-02-21 DIAGNOSIS — I10 Essential (primary) hypertension: Secondary | ICD-10-CM | POA: Diagnosis not present

## 2016-02-21 DIAGNOSIS — E7800 Pure hypercholesterolemia, unspecified: Secondary | ICD-10-CM | POA: Diagnosis not present

## 2016-02-21 DIAGNOSIS — Z79891 Long term (current) use of opiate analgesic: Secondary | ICD-10-CM | POA: Diagnosis not present

## 2016-02-21 DIAGNOSIS — M4806 Spinal stenosis, lumbar region: Secondary | ICD-10-CM

## 2016-02-21 DIAGNOSIS — M549 Dorsalgia, unspecified: Secondary | ICD-10-CM | POA: Diagnosis present

## 2016-02-21 DIAGNOSIS — M47816 Spondylosis without myelopathy or radiculopathy, lumbar region: Secondary | ICD-10-CM | POA: Diagnosis not present

## 2016-02-21 DIAGNOSIS — M25552 Pain in left hip: Secondary | ICD-10-CM

## 2016-02-21 DIAGNOSIS — M533 Sacrococcygeal disorders, not elsewhere classified: Secondary | ICD-10-CM | POA: Diagnosis not present

## 2016-02-21 DIAGNOSIS — G4730 Sleep apnea, unspecified: Secondary | ICD-10-CM | POA: Diagnosis not present

## 2016-02-21 DIAGNOSIS — M5136 Other intervertebral disc degeneration, lumbar region with discogenic back pain only: Secondary | ICD-10-CM

## 2016-02-21 DIAGNOSIS — K219 Gastro-esophageal reflux disease without esophagitis: Secondary | ICD-10-CM | POA: Diagnosis not present

## 2016-02-21 DIAGNOSIS — M223X9 Other derangements of patella, unspecified knee: Secondary | ICD-10-CM

## 2016-02-21 DIAGNOSIS — R29898 Other symptoms and signs involving the musculoskeletal system: Secondary | ICD-10-CM

## 2016-02-21 DIAGNOSIS — F32A Depression, unspecified: Secondary | ICD-10-CM | POA: Insufficient documentation

## 2016-02-21 DIAGNOSIS — M1288 Other specific arthropathies, not elsewhere classified, other specified site: Secondary | ICD-10-CM | POA: Diagnosis not present

## 2016-02-21 DIAGNOSIS — Z6841 Body Mass Index (BMI) 40.0 and over, adult: Secondary | ICD-10-CM | POA: Diagnosis not present

## 2016-02-21 DIAGNOSIS — G8929 Other chronic pain: Secondary | ICD-10-CM | POA: Diagnosis not present

## 2016-02-21 DIAGNOSIS — F418 Other specified anxiety disorders: Secondary | ICD-10-CM | POA: Insufficient documentation

## 2016-02-21 DIAGNOSIS — M79606 Pain in leg, unspecified: Secondary | ICD-10-CM

## 2016-02-21 DIAGNOSIS — M545 Low back pain, unspecified: Secondary | ICD-10-CM

## 2016-02-21 DIAGNOSIS — Z5181 Encounter for therapeutic drug level monitoring: Secondary | ICD-10-CM

## 2016-02-21 DIAGNOSIS — D352 Benign neoplasm of pituitary gland: Secondary | ICD-10-CM | POA: Diagnosis not present

## 2016-02-21 DIAGNOSIS — Z87442 Personal history of urinary calculi: Secondary | ICD-10-CM | POA: Insufficient documentation

## 2016-02-21 DIAGNOSIS — M7918 Myalgia, other site: Secondary | ICD-10-CM

## 2016-02-21 DIAGNOSIS — F1190 Opioid use, unspecified, uncomplicated: Secondary | ICD-10-CM

## 2016-02-21 DIAGNOSIS — M25561 Pain in right knee: Secondary | ICD-10-CM

## 2016-02-21 DIAGNOSIS — M791 Myalgia: Secondary | ICD-10-CM | POA: Diagnosis not present

## 2016-02-21 DIAGNOSIS — M48061 Spinal stenosis, lumbar region without neurogenic claudication: Secondary | ICD-10-CM

## 2016-02-21 DIAGNOSIS — F410 Panic disorder [episodic paroxysmal anxiety] without agoraphobia: Secondary | ICD-10-CM | POA: Insufficient documentation

## 2016-02-21 DIAGNOSIS — Z0189 Encounter for other specified special examinations: Secondary | ICD-10-CM

## 2016-02-21 DIAGNOSIS — Z79899 Other long term (current) drug therapy: Secondary | ICD-10-CM

## 2016-02-21 DIAGNOSIS — Z9889 Other specified postprocedural states: Secondary | ICD-10-CM | POA: Insufficient documentation

## 2016-02-21 DIAGNOSIS — F329 Major depressive disorder, single episode, unspecified: Secondary | ICD-10-CM | POA: Insufficient documentation

## 2016-02-21 MED ORDER — CYCLOBENZAPRINE HCL 10 MG PO TABS: 10.0000 mg | ORAL_TABLET | Freq: Two times a day (BID) | ORAL | Status: DC

## 2016-02-21 MED ORDER — MELOXICAM 7.5 MG PO TABS: 7.5000 mg | ORAL_TABLET | Freq: Two times a day (BID) | ORAL | Status: DC

## 2016-02-21 NOTE — Progress Notes (Signed)
Patient's Name: Gerald Harris  Patient type: New patient  MRN: [id number]  Service setting: Ambulatory outpatient  DOB: June 30, 1971  Location: [facility]  DOS: 02/21/2016  Primary Care Physician: Tracie Harrier, MD  Note by: Kathlen [place]. Dossie Arbour, M.D, DABA, DABAPM, DABPM, DABIPP, FIPP  Referring Physician: Tracie Harrier, MD  Specialty: Board-Certified Interventional Pain Management     Primary Reason(s) for Visit: Initial Patient Evaluation CC: Back Pain   HPI  Gerald Harris is a 45 y.o. year old, male patient, who comes today for an initial evaluation. He has Body mass index (BMI) of 40.0-44.9 in adult [facility]); Clinical depression; Anxiety, generalized; Acid reflux; Hypercholesterolemia; BP (high blood pressure); Calculus of kidney; Apnea, sleep; Chronic pain; Chronic low back pain (Location of Primary Source of Pain) (Bilateral) (L>R); Chronic lower extremity pain (Location of Secondary source of pain) (Bilateral) (R>L); Lumbar spondylosis; Chronic knee pain (Location of Tertiary source of pain) (Bilateral) (R>L); Long term current use of opiate analgesic; Long term prescription opiate use; Opiate use (22.5 MME/Day); Encounter for therapeutic drug level monitoring; Encounter for pain management planning; Musculoskeletal pain; Chronic left hip pain; Chronic right sacroiliac joint pain; Discogenic low back pain; Lumbar facet syndrome (Location of Primary Source of Pain) (Bilateral) (L>R); Pituitary microadenoma ([place]); Chondromalacia patellae of knee (Right); 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 His primarily concern today is the Back Pain   Pain Assessment: Self-Reported Pain Score: 7  Clinically the patient looks like a 2/10 Reported level is inconsistent with clinical obrservations Information on the proper use of the pain score provided to  the patient today. Pain Type: Chronic pain Pain Location: Back Pain Orientation: Lower Pain Descriptors / Indicators: Constant, Numbness, Sharp, Other (Comment) (numbness in the Left leg.  pain is described as jolt of electricity) Pain Frequency: Constant  Onset and Duration: Sudden, Date of onset: October 2016 and Present longer than 3 months Cause of pain: Unknown Severity: Getting worse, NAS-11 at its worse: 10/10, NAS-11 at its best: 3/10, NAS-11 now: 6/10 and NAS-11 on the average: 9/10 Timing: Not influenced by the time of the day, During activity or exercise, After activity or exercise and After a period of immobility Aggravating Factors: Bending, Climbing, Intercourse (sex), Kneeling, Lifiting, Motion, Prolonged sitting, Prolonged standing, Squatting, Stooping , Twisting, Walking, Walking uphill, Walking downhill and Working Alleviating Factors: Hot packs, Lying down, Medications and Warm showers or baths Associated Problems: Numbness, Spasms, Tingling, Weakness, Pain that wakes patient up and Pain that does not allow patient to sleep Quality of Pain: Agonizing, Cramping, Disabling, Horrible, Sharp, Shooting and Stabbing Previous Examinations or Tests: CT scan, Ct-Myelogram, MRI scan and X-rays Previous Treatments: Epidural steroid injections, Narcotic medications, Steroid treatments by mouth and Stretching exercises  The patient comes into the clinics today for the first time for a chronic pain management evaluation. According to the patient the primary area of pain is that of the lower back with the pain being in the center and spreading to both sides with the left being worst on the right. Following this the secondary area of worst pain is that of the lower extremities with the right being worst on the left. In the case of both lower extremities the pain goes down through the posterior aspect of the leg occasionally going all the way down into the heel but seldom into the foot. This pain  appears to be referred. However, the patient  also tends to experience numbness of the left lower extremity in the anterior thigh area in the medial aspect of the distal leg. Occasionally he describes having weakness of the hamstrings and calf area on both lower extremities which usually follows severe pain. The third area of worst pain is that of the knees with the right being worst on the left. The patient describes the pain in the knee area to be in the anterior aspect of the knee, right under the patella. This is true for both knees. His next area of pain is that of headaches which he calls "migraines". He describes the location of these migraines to be in the posterior and the top portion of the head with both sides being affected equally and at the same time. Occasionally this is accompanied by bilateral shoulder stiffness. When he gets the headaches he indicates having photosensitivity, phono sensitivity, as well as sensitivity to strong smells. This will occasionally be accompanied by nausea and cold sweats. At this point it is important to point out that the patient has a history of a microadenoma of the pituitary gland that apparently was discovered around 2006 (according to patient). And MRI of the brain done in 2008 is available describing a 4 mm microadenoma of the pituitary gland and the posterior aspect. In addition to these migraines, the patient also describes having tension headaches and cluster headaches. He indicates that he has been fully worked up by other headache specialists for these. He describes a tension headache as being in the area of the temple, above the eyes, and alternating sides but never being bilateral. The cluster headaches are described to be in the area of the for head, bilaterally, and according to the patient the only thing that helps them is putting pressure over the for head. The patient served in the [employer] but indicates that the headaches started when he was 45 years  old.  With regards to the medications, he indicates taking oxycodone/APAP 7.5 one tablet by mouth twice a day when necessary for the pain, as well as Flexeril 10 mg by mouth twice a day. He indicates that he is unable to take the Flexeril at bedtime because it causes him to have restless leg and twitches. In addition to this, the patient indicates taking ibuprofen 200 mg, up to 4 tablets at a time (800 mg) twice a day. In addition he indicates taking meloxicam 7.5 mg by mouth twice a day. The patient was informed of the dual therapy with the NSAIDs and recommended to stop using one of the 2.  History of pituitary tumor, found in 2006 & treated by [facility].  Historic Controlled Substance Pharmacotherapy Review  Previously Prescribed Opioids: Oxycodone/APAP 7.5/325 one tablet by mouth twice a day. Currently Prescribed Analgesic: Oxycodone/APAP 7.5/325 one tablet by mouth twice a day (15 mg/day) Medications: The patient did not bring the medication(s) to the appointment, as requested in our "New Patient Package" MME/day: 22.5 mg/day Pharmacodynamics: Analgesic Effect: More than 50% Activity Facilitation: Medication(s) allow patient to sit, stand, walk, and do the basic ADLs Perceived Effectiveness: Described as relatively effective, allowing for increase in activities of daily living (ADL) Side-effects or Adverse reactions: None reported Historical Background Evaluation: [place] PDMP: Five (5) year initial data search conducted. No abnormal patterns identified [place] [employer] Public [employer] Offender Public Information: Non-contributory UDS Results: No UDS results available at this time UDS Interpretation: N/A Medication Assessment Form: Not applicable. Initial evaluation. The patient has not received  any medications from our practice Treatment compliance: Not applicable. Initial evaluation Risk Assessment: Aberrant Behavior: None observed or detected today Opioid Fatal  Overdose Risk Factors: None identified today Non-fatal overdose hazard ratio (HR): Calculation deferred Fatal overdose hazard ratio (HR): Calculation deferred Substance Use Disorder (SUD) Risk Level: Pending results of Medical Psychology Evaluation for SUD Opioid Risk Tool (ORT) Score: Total Score: 0 Low Risk for SUD (Score <3) Depression Scale Score: PHQ-2: PHQ-2 Total Score: 0 No depression (0) PHQ-9: PHQ-9 Total Score: 0 No depression (0-4)  Pharmacologic Plan: Pending ordered tests and/or consults  Historical Illicit Drug Screen Labs(s): No results found for: MDMA, COCAINSCRNUR, PCPSCRNUR, THCU, ETH  Meds  The patient has a current medication list which includes the following prescription(s): clonazepam, hydrochlorothiazide, ibuprofen, lisinopril, meloxicam, oxycodone-acetaminophen, paroxetine hcl, and cyclobenzaprine.  Imaging Review  Lumbosacral Imaging: Lumbar MR w/wo contrast:  Results for orders placed during the hospital encounter of 06/21/15  MR Lumbar Spine W Wo Contrast   Narrative CLINICAL DATA:  Midline low back pain for 1 month. Some radiation across the flanks but no radiation into the groin, buttocks, or legs.  EXAM: MRI LUMBAR SPINE WITHOUT AND WITH CONTRAST  TECHNIQUE: Multiplanar and multiecho pulse sequences of the lumbar spine were obtained without and with intravenous contrast.  CONTRAST:  64mL MULTIHANCE GADOBENATE DIMEGLUMINE 529 MG/ML IV SOLN  COMPARISON:  CT abdomen and pelvis 05/30/2015  FINDINGS: Image quality is degraded by patient body habitus. The lowest fully formed intervertebral disc space is labeled L5-S1.  There is slight right convex curvature of the lumbar spine. There is no listhesis. Vertebral body heights are preserved. At L3 vertebral body hemangioma is again seen. No vertebral marrow edema is identified. No suspicious enhancing lesion is seen. The conus medullaris is normal in signal and terminates at L1. Paraspinal  soft tissues are unremarkable.  L1-2 through L3-4: No disc herniation or stenosis. At most minimal facet arthrosis.  L4-5: Disc desiccation with minimal disc space height loss. Mild circumferential disc bulging and minimal facet arthrosis result in moderate bilateral neural foraminal stenosis without spinal stenosis.  L5-S1:  Negative.  IMPRESSION: Single level disc degeneration at L4-5 with moderate bilateral neural foraminal stenosis. No lumbar spinal stenosis.   Electronically Signed   By: Logan Bores M.D.   On: 06/21/2015 17:15    Knee Imaging: Knee-R MR wo contrast:  Results for orders placed during the hospital encounter of 11/07/14  MR Knee Right Wo Contrast   Narrative * PRIOR REPORT IMPORTED FROM AN EXTERNAL SYSTEM *   CLINICAL DATA:  RIGHT knee pain. Initial encounter. Fall. RIGHT knee  dislocation 2 weeks ago. Unable the weightbear.   EXAM:  MRI OF THE RIGHT KNEE WITHOUT CONTRAST   TECHNIQUE:  Multiplanar, multisequence MR imaging of the knee was performed. No  intravenous contrast was administered.   COMPARISON:  10/27/2014.   FINDINGS:  MENISCI   Medial meniscus:  Normal.   Lateral meniscus:  Normal.   LIGAMENTS   Cruciates:  Intact.   Collaterals:  Intact.   CARTILAGE   Patellofemoral: Age advanced severe chondromalacia patella. Grade IV  central lateral patellar facet chondromalacia with subchondral edema  and cystic changes. Lateral patellar tilt. Findings are compatible  with excessive lateral pressure syndrome. Trochlear cartilage  preserved by comparison. Tibial tuberosity to trochlear groove  distance borderline at 16 mm. Shallow trochlear groove.   Medial:  Normal.   Lateral:  Normal.   Joint:  Small effusion.   Popliteal Fossa:  Small  uncomplicated Baker's cyst.   Extensor Mechanism: Quadriceps and patellar tendons intact. Scarring  is present along the lateral patellar retinaculum suggesting prior  release. Medial  patellofemoral retinaculum appears normal.   Bones: Developing intraosseous ganglion in the medial aspect of the  posterior tibial plateau.   IMPRESSION:  1. 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  2. Susceptibility artifact along the lateral patellar retinaculum  suggesting previous release.  3. No findings of recent transient lateral patellar dislocation.    Electronically Signed    By: Dereck Ligas M.D.    On: 11/07/2014 14:48       Knee-R DG 1-2 views:  Results for orders placed in visit on 10/27/14  DG Knee 1-2 Views Right   Narrative * PRIOR REPORT IMPORTED FROM AN EXTERNAL SYSTEM *   CLINICAL DATA:  Fall, twisting injury right knee. Knee cap move data  place. Patient put back in place.   EXAM:  RIGHT KNEE - 1-2 VIEW   COMPARISON:  None.   FINDINGS:  Mild degenerative changes within the right knee, most pronounced in  the patellofemoral compartment. No fracture, subluxation or  dislocation. Patella located in expected position. No joint  effusion.   IMPRESSION:  Mild degenerative changes.  No acute bony abnormality.    Electronically Signed    By: Rolm Baptise M.D.    On: 10/27/2014 23:44       Note: Imaging results reviewed.  ROS  Cardiovascular History: Negative for hypertension, coronary artery diseas, myocardial infraction, anticoagulant therapy or heart failure Pulmonary or Respiratory History: Snoring  and Sleep apnea Neurological History: Negative for epilepsy, stroke, urinary or fecal inontinence, spina bifida or tethered cord syndrome Review of Past Neurological Studies:  Results for orders placed or performed during the hospital encounter of 08/11/07  MR Brain W Wo Contrast   Narrative   Clinical Data: Elevated prolactin.  MRI BRAIN WITHOUT AND WITH CONTRAST:  Technique: Multiplanar and multiecho pulse sequences of the brain and surrounding structures were obtained according to  dynamic pituitary protocol before and after administration of intravenous contrast.  Contrast: 14 cc Magnevist.  Comparison: None.  Findings: Ventricles are normal in size. 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 No infarct is identified. The white matter has normal signal and the enhancement pattern of the brain is normal. Pituitary gland is not enlarged. The infundibulum is midline and normal. There is no compression of the optic chiasm and the cavernous sinus appears normal. On sagittal images, there is a small 4 mm microadenoma in the posterior pituitary which shows hypoenhancement relative to the remainder of the gland. It also shows slight depression in the floor of the sella, again best seen on sagittal images.  IMPRESSION:  4 mm pituitary microadenoma in the posterior pituitary. Otherwise negative.    Provider: Sammie Bench   Psychological-Psychiatric History: Anxiety and Panic Attacks Gastrointestinal History: Negative for peptic ulcer disease, hiatal hernia, GERD, IBS, hepatitis, cirrhosis or pancreatitis Genitourinary History: Nephrolithiasis Hematological History: Negative for anticoagulant therapy, anemia, bruising or bleeding easily, hemophilia, sickle cell disease or trait, thrombocytopenia or coagulupathies Endocrine History: Negative for diabetes or thyroid disease Rheumatologic History: Negative for lupus, osteoarthritis, rheumatoid arthritis, myositis, polymyositis or fibromyagia Musculoskeletal History: Negative for myasthenia gravis, muscular dystrophy, multiple sclerosis or malignant hyperthermia Work History: Working full time in [employer].  Allergies  Gerald Harris is allergic to ciprofloxacin.  Laboratory Chemistry  Inflammation Markers  No results found for: ESRSEDRATE, CRP  Renal Function Lab Results  Component Value Date   BUN 16 08/30/2015   CREATININE 0.62 08/30/2015   GFRAA >60 08/30/2015   GFRNONAA >60  08/30/2015    Hepatic Function Lab Results  Component Value Date   AST 21 05/30/2015   ALT 25 05/30/2015   ALBUMIN 4.0 05/30/2015    Electrolytes Lab Results  Component Value Date   NA 141 08/30/2015   K 3.5 08/30/2015   CL 104 08/30/2015   CALCIUM 9.1 08/30/2015    Pain Modulating Vitamins No results found for: Maralyn Sago [id number], [id number], [id number], 25OHVITD1, 25OHVITD2, 25OHVITD3, VITAMINB12  Coagulation Parameters Lab Results  Component Value Date   PLT 227 08/30/2015    Cardiovascular Lab Results  Component Value Date   HGB 15.2 08/30/2015   HCT 45.7 08/30/2015    Note: Lab results reviewed.  [place]  Medical:  Gerald Harris  has a past medical history of Kidney stones; Hypertension; DDD (degenerative disc disease), lumbar (09/12/2015); Nerve root pain (01/31/2016); Radiculopathy of lumbosacral region (09/12/2015); Status post spinal surgery (09/12/2015); and Back pain at L4-L5 level (09/12/2015). Family: family history includes Atrial fibrillation in his mother; Cancer in his father. Surgical:  has past surgical history that includes Cholecystectomy; Knee surgery (Right); and Abdominal exploration surgery. Tobacco:  reports that he has quit smoking. He does not have any smokeless tobacco history on file. Alcohol:  reports that he does not drink alcohol. Drug:  reports that he does not use illicit drugs. Active Ambulatory Problems    Diagnosis Date Noted  . Body mass index (BMI) of 40.0-44.9 in adult ([place]) 06/04/2015  . Clinical depression 02/21/2016  . Anxiety, generalized 11/14/2015  . Acid reflux 02/21/2016  . Hypercholesterolemia 02/21/2016  . BP (high blood pressure) 02/21/2016  . Calculus of kidney 02/21/2016  . Apnea, sleep 02/21/2016  . Chronic pain 02/21/2016  . Chronic low back pain (Location of Primary Source of Pain) (Bilateral) (L>R) 02/21/2016  . Chronic lower extremity pain (Location of Secondary source of pain) (Bilateral) (R>L) 02/21/2016  . Lumbar  spondylosis 02/21/2016  . Chronic knee pain (Location of Tertiary source of pain) (Bilateral) (R>L) 02/21/2016  . Long term current use of opiate analgesic 02/21/2016  . Long term prescription opiate use 02/21/2016  . Opiate use (22.5 MME/Day) 02/21/2016  . Encounter for therapeutic drug level monitoring 02/21/2016  . Encounter for pain management planning 02/21/2016  . Musculoskeletal pain 02/21/2016  . Chronic left hip pain 02/21/2016  . Chronic right sacroiliac joint pain 02/21/2016  . Discogenic low back pain 02/21/2016  . Lumbar facet syndrome (Location of Primary Source of Pain) (Bilateral) (L>R) 02/21/2016  . Pituitary microadenoma ([place]) 02/21/2016  . Chondromalacia patellae of knee (Right) 02/21/2016  . 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 Lumbar foraminal stenosis at L4-5 (Bilateral) 02/21/2016  . Lumbar facet arthropathy (multilevel) 02/21/2016   Resolved Ambulatory Problems    Diagnosis Date Noted  . No Resolved Ambulatory Problems   Past Medical History  Diagnosis Date  . Kidney stones   . Hypertension   . DDD (degenerative disc disease), lumbar 09/12/2015  . Nerve root pain 01/31/2016  . Radiculopathy of lumbosacral region 09/12/2015  . Status post spinal surgery 09/12/2015  . Back pain at L4-L5 level 09/12/2015    Constitutional Exam  Vitals: Blood pressure 123/85, pulse 82, temperature 97.8 F (36.6 C), temperature source Oral, resp. rate 16, height 5\' 11"  (1.803 m), weight 275 lb (124.739 kg),  SpO2 98 %. General appearance: Well nourished, well developed, and well hydrated. In no acute distress Calculated BMI/Body habitus: Body mass index is 38.37 kg/(m^2). (35-39.9 kg/m2) Severe obesity (Class II) - 136% higher incidence of chronic pain Psych/Mental status: Alert and oriented x 3 (person, place, & time) Eyes: PERLA Respiratory: No evidence of acute respiratory distress  Cervical Spine Exam  Inspection: No masses, redness, or  swelling Alignment: Symmetrical ROM: Functional: Adequate ROM Active: Unrestricted ROM Stability: No instability detected Muscle strength & Tone: Functionally intact Sensory: Unimpaired Palpation: No complaints of tenderness  Upper Extremity (UE) Exam    Side: Right upper extremity  Side: Left upper extremity  Inspection: No masses, redness, swelling, or asymmetry  Inspection: No masses, redness, swelling, or asymmetry  ROM:  ROM:  Functional: Adequate ROM        Functional: Adequate ROM        Active: Unrestricted ROM  Active: Unrestricted ROM  Muscle strength & Tone: Functionally intact  Muscle strength & Tone: Functionally intact  Sensory: Unimpaired  Sensory: Unimpaired  Palpation: No complaints of tenderness  Palpation: No complaints of tenderness   Thoracic Spine Exam  Inspection: No masses, redness, or swelling Alignment: Symmetrical ROM: Functional: Adequate ROM Active: Unrestricted ROM Stability: No instability detected Sensory: Unimpaired Muscle strength & Tone: Functionally intact Palpation: No complaints of tenderness  Lumbar Spine Exam  Inspection: No masses, redness, or swelling Alignment: Symmetrical ROM: Functional: Adequate ROM Active: Limited ROM Stability: No instability detected Muscle strength & Tone: Increased muscle tone and tension Sensory: Movement-associated pain Palpation: Area tender to palpation Provocative Tests: Lumbar Hyperextension and rotation test: Positive bilaterally for facet joint pain. Patrick's Maneuver: Positive for right-sided S-I joint pain and for left hip joint pain.  Gait & Posture Assessment  Ambulation: Unassisted Gait: Antalgic Posture: WNL   Lower Extremity Exam    Side: Right lower extremity  Side: Left lower extremity  Inspection: No masses, redness, swelling, or asymmetry ROM:  Inspection: No masses, redness, swelling, or asymmetry ROM:  Functional: Adequate ROM        Functional: Adequate ROM        Active:  Unrestricted ROM  Active: Unrestricted ROM  Muscle strength & Tone: Able to Toe-walk & Heel-walk without problems  Muscle strength & Tone: Able to Toe-walk & Heel-walk without problems  Sensory: Unimpaired  Sensory: Unimpaired  Palpation: No complaints of tenderness  DTR: Within normal limits and equal bilaterally for the patellar tendon and Achilles tendon.  Palpation: No complaints of tenderness  DTR: Within normal limits and equal bilaterally for the patellar tendon and Achilles tendon.   Assessment  Primary Diagnosis & Pertinent Problem List: The primary encounter diagnosis was Chronic pain. Diagnoses of Chronic low back pain, Chronic pain of lower extremity, unspecified laterality, Lumbar spondylosis, unspecified spinal osteoarthritis, Chronic knee pain (Right), Long term current use of opiate analgesic, Long term prescription opiate use, Opiate use, Encounter for therapeutic drug level monitoring, Encounter for pain management planning, Musculoskeletal pain, Chronic left hip pain, Chronic right sacroiliac joint pain, Discogenic low back pain, Lumbar facet syndrome (Location of Primary Source of Pain) (Bilateral) (L>R), Pituitary microadenoma ([facility]), Chondromalacia patellae of knee (Right), 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  Visit Diagnosis: 1. Chronic pain   2. Chronic low back pain   3. Chronic pain of lower extremity, unspecified laterality   4. Lumbar spondylosis, unspecified spinal osteoarthritis  5. Chronic knee pain (Right)   6. Long term current use of opiate analgesic   7. Long term prescription opiate use   8. Opiate use   9. Encounter for therapeutic drug level monitoring   10. Encounter for pain management planning   11. Musculoskeletal pain   12. Chronic left hip pain   13. Chronic right sacroiliac joint pain   14. Discogenic low  back pain   15. Lumbar facet syndrome (Location of Primary Source of Pain) (Bilateral) (L>R)   16. Pituitary microadenoma ([place])   17. Chondromalacia patellae of knee (Right)   18. 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 Lumbar foraminal stenosis at L4-5 (Bilateral)   20. Lumbar facet arthropathy (multilevel)     Assessment: No problem-specific assessment & plan notes found for this encounter.   Plan of Care  Initial Treatment Plan:  Please be advised that as per protocol, today's visit has been an evaluation only. We have not taken over the patient's controlled substance management.  Problem List Items Addressed This Visit      High   Chondromalacia patellae of knee (Right) (Chronic)   Chronic knee pain (Location of Tertiary source of pain) (Bilateral) (R>L) (Chronic)   Chronic left hip pain (Chronic)   Relevant Orders   DG HIP UNILAT W OR W/O PELVIS 2-3 VIEWS LEFT   Chronic low back pain (Location of Primary Source of Pain) (Bilateral) (L>R) (Chronic)   Relevant Medications   cyclobenzaprine (FLEXERIL) 10 MG tablet   meloxicam (MOBIC) 7.5 MG tablet   Other Relevant Orders   DG Lumbar Spine Complete W/Bend   Chronic lower extremity pain (Location of Secondary source of pain) (Bilateral) (R>L) (Chronic)   Chronic pain - Primary (Chronic)   Relevant Medications   cyclobenzaprine (FLEXERIL) 10 MG tablet   meloxicam (MOBIC) 7.5 MG tablet   Other Relevant Orders   Comprehensive metabolic panel   C-reactive protein   Magnesium   Sedimentation rate   Vitamin B12   25-Hydroxyvitamin D Lcms D2+D3   Ambulatory referral to Psychology   Chronic right sacroiliac joint pain (Chronic)   Relevant Medications   cyclobenzaprine (FLEXERIL) 10 MG tablet   meloxicam (MOBIC) 7.5 MG tablet   Other Relevant Orders   DG Si Joints   Discogenic low back pain (Chronic)   Relevant Medications   cyclobenzaprine (FLEXERIL) 10 MG tablet   meloxicam (MOBIC) 7.5 MG tablet    Other Relevant Orders   DG Lumbar Spine Complete W/Bend   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)   Lumbar facet arthropathy (multilevel) (Chronic)   Lumbar facet syndrome (Location of Primary Source of Pain) (Bilateral) (L>R) (Chronic)   Relevant Medications   cyclobenzaprine (FLEXERIL) 10 MG tablet   meloxicam (MOBIC) 7.5 MG tablet   Lumbar foraminal stenosis at L4-5 (Bilateral) (Chronic)   Lumbar spondylosis (Chronic)   Relevant Medications   cyclobenzaprine (FLEXERIL) 10 MG tablet   meloxicam (MOBIC) 7.5 MG tablet   Other Relevant Orders   DG Lumbar Spine Complete W/Bend   Musculoskeletal pain (Chronic)   Relevant Medications   cyclobenzaprine (FLEXERIL) 10 MG tablet     Medium   Encounter for pain management planning   Encounter for therapeutic drug level monitoring   Long term current use of opiate analgesic (Chronic)   Relevant Orders   Compliance Drug Analysis, Ur   Ambulatory referral to Psychology   Long term prescription opiate use (Chronic)   Opiate use (22.5 MME/Day) (Chronic)  Low   Pituitary microadenoma (HCC)      Pharmacotherapy (Medications Ordered): Meds ordered this encounter  Medications  . cyclobenzaprine (FLEXERIL) 10 MG tablet    Sig: Take 1 tablet (10 mg total) by mouth 2 (two) times daily.    Dispense:  60 tablet    Refill:  0    Do not place this medication, or any other prescription from our practice, on "Automatic Refill". Patient may have prescription filled one day early if pharmacy is closed on scheduled refill date.  . meloxicam (MOBIC) 7.5 MG tablet    Sig: Take 1 tablet (7.5 mg total) by mouth 2 (two) times daily after a meal.    Dispense:  60 tablet    Refill:  0    Do not add this medication to the electronic "Automatic Refill" notification system. Patient may have prescription filled one day early if pharmacy is closed on scheduled refill date.    Lab-work & Procedure Ordered: Orders Placed This Encounter   Procedures  . DG Lumbar Spine Complete W/Bend  . DG Si Joints  . DG HIP UNILAT W OR W/O PELVIS 2-3 VIEWS LEFT  . Compliance Drug Analysis, Ur  . Comprehensive metabolic panel  . C-reactive protein  . Magnesium  . Sedimentation rate  . Vitamin B12  . 25-Hydroxyvitamin D Lcms D2+D3  . Ambulatory referral to Psychology    Imaging Ordered: AMB REFERRAL TO PSYCHOLOGY DG LUMBAR SPINE COMPLETE W/BEND 6+V DG SI JOINTS DG HIP UNILAT W OR W/O PELVIS 2-3 VIEWS LEFT  Interventional Therapies: Scheduled:  None at this time.    Considering:   1. Diagnostic bilateral lumbar facet block under fluoroscopic guidance and IV sedation.  2. Possible bilateral lumbar facet radiofrequency ablation under fluoroscopic guidance and IV sedation the pending on the results of the diagnostic injection.  3. Diagnostic left sided intra-articular hip joint injection under fluoroscopic guidance, with or without sedation.  4. Possible left-sided hip joint radiofrequency ablation under fluoroscopic guidance and IV sedation the pending on the results of the diagnostic injection.  5. Diagnostic right-sided sacroiliac joint injection under fluoroscopic guidance, with or without sedation.  6. Possible right-sided sacroiliac joint radiofrequency ablation under fluoroscopic guidance and IV sedation, the pending on the results of the diagnostic injection.  7. Diagnostic bilateral intra-articular knee injection, without fluoroscopic guidance or IV sedation.  8. Possible series of 5 intra-articular Hyalgan knee injections, without fluoroscopy or IV sedation.  9. Diagnostic bilateral Genicular nerve block under fluoroscopic guidance and IV sedation.  10. Possible bilateral Genicular nerve radiofrequency ablation under fluoroscopic guidance and IV sedation the pending on the results of the diagnostic injection.   PRN Procedures:  None at this time.    Referral(s) or Consult(s): Medical psychology consult for substance use  disorder evaluation  Medications administered during this visit: Gerald Harris had no medications administered during this visit.  Prescriptions ordered during this visit: New Prescriptions   CYCLOBENZAPRINE (FLEXERIL) 10 MG TABLET    Take 1 tablet (10 mg total) by mouth 2 (two) times daily.    Requested PM Follow-up: Return for After [facility] Eval.  No future appointments.   Primary Care Physician: Tracie Harrier, MD Location: [facility] Note by: Kathlen Brunswick. Dossie Arbour, M.D, DABA, DABAPM, DABPM, DABIPP, FIPP  Pain Score Disclaimer: We use the NRS-11 scale. This is a self-reported, subjective measurement of pain severity with only modest accuracy. It is used primarily to identify changes within a particular patient. It must be understood that  outpatient pain scales are significantly less accurate that those used for research, where they can be applied under ideal controlled circumstances with minimal exposure to variables. In reality, the score is likely to be a combination of pain intensity and pain affect, where pain affect describes the degree of emotional arousal or changes in action readiness caused by the sensory experience of pain. Factors such as social and work situation, setting, emotional state, anxiety levels, expectation, and prior pain experience may influence pain perception and show large inter-individual differences that may also be affected by time variables.  Patient instructions provided during this appointment: There are no Patient Instructions on file for this visit.

## 2016-02-21 NOTE — Progress Notes (Signed)
Patient here today as a new patient to Dr Dossie Arbour.  Patient was affiliated with Dr Idelia Salm.  He is here today for pain medication.  Patient feels that he may have injured or over done it while moving his household this past week. Safety precautions to be maintained throughout the outpatient stay will include: orient to surroundings, keep bed in low position, maintain call bell within reach at all times, provide assistance with transfer out of bed and ambulation.

## 2016-03-05 LAB — COMPLIANCE DRUG ANALYSIS, UR: PDF: 0

## 2016-03-09 NOTE — Progress Notes (Signed)
NOTE: This forensic urine drug screen (UDS) test was conducted using a state-of-the-art ultra high performance liquid chromatography and mass spectrometry system (UPLC/MS-MS), the most sophisticated and accurate method available. UPLC/MS-MS is 1,000 times more precise and accurate than standard gas chromatography and mass spectrometry (GC/MS). This system can analyze 26 drug categories and 180 drug compounds.  Positive, undisclosed use of illicit substance (Cannabinoids). Our program has a "Zero Tolerance" for the use of illicit substances. As a consequence of the results obtained on this test, we will no longer offer controlled substances as a therapeutic option for this patient, until the UDS return clear. In addition, we will check to see if this is a recurrent event. Today the patient will be given a final warning and informed that should this event happen again, we will no longer continue to offer opioids as an alternative treatment option. In the event that our records reveal that this warning had previously been given to the patient, we will then immediately discontinue this mode of therapy.

## 2016-03-17 ENCOUNTER — Telehealth: Payer: Self-pay

## 2016-03-17 DIAGNOSIS — F1290 Cannabis use, unspecified, uncomplicated: Secondary | ICD-10-CM | POA: Insufficient documentation

## 2016-03-17 DIAGNOSIS — Z79899 Other long term (current) drug therapy: Secondary | ICD-10-CM | POA: Insufficient documentation

## 2016-03-17 NOTE — Telephone Encounter (Signed)
Pt is not happy that Dr Delane Ginger first available appt is not until 8/31. Pt was a Dr Idelia Salm pt. Pt wants Dr Dossie Arbour to write him a script until he can see him. I did let him know that Dr.Naveira normally does not do that. Pt became furious and said that our office keeps giving him the run around.

## 2016-03-17 NOTE — Progress Notes (Deleted)
Patient's Name: Gerald Harris  Patient type: Established  MRN: [id number]  Service setting: Ambulatory outpatient  DOB: September 04, 1970  Location: [facility]  DOS: 03/18/2016  Primary Care Physician: Tracie Harrier, MD  Note by: Kathlen Brunswick. Dossie Arbour, M.D, DABA, DABAPM, DABPM, DABIPP, FIPP  Referring Physician: Tracie Harrier, MD  Specialty: Board-Certified Interventional Pain Management  Last Visit to Pain Management: 03/17/2016   Primary Reason(s) for Visit: Encounter for evaluation before starting new chronic pain management plan of care (Level of risk: moderate) CC: No chief complaint on file.   HPI  Gerald Harris is a 45 y.o. year old, male patient, who returns today as an established patient. He has Body mass index (BMI) of 40.0-44.9 in adult [facility]); Clinical depression; Anxiety, generalized; Acid reflux; Hypercholesterolemia; BP (high blood pressure); Calculus of kidney; Apnea, sleep; Chronic pain; Chronic low back pain (Location of Primary Source of Pain) (Bilateral) (L>R); Chronic lower extremity pain (Location of Secondary source of pain) (Bilateral) (R>L); Lumbar spondylosis; Chronic knee pain (Location of Tertiary source of pain) (Bilateral) (R>L); Long term current use of opiate analgesic; Long term prescription opiate use; Opiate use (22.5 MME/Day); Encounter for therapeutic drug level monitoring; Encounter for pain management planning; Musculoskeletal pain; Chronic hip pain (Left); Chronic sacroiliac joint pain (Right); Discogenic low back pain; Lumbar facet syndrome (Location of Primary Source of Pain) (Bilateral) (L>R); Pituitary microadenoma ([place]); Chondromalacia patellae of knee (Right); 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 His primarily concern today is the No chief complaint on  file.   Pain Assessment: Self-Reported               Reported level is compatible with observation          The patient comes into the clinics today for post-procedure evaluation on the interventional treatment done on 02/21/2016. In addition, he comes in today for pharmacological management of his chronic pain.  The patient  reports that he does not use drugs. Unfortunately, they UDS came back indicating that the patient was positive for cannabinoids. In addition, the patient did not complete any of the lab work that we had ordered.       Controlled Substance Pharmacotherapy Assessment & REMS (Risk Evaluation and Mitigation Strategy)  Analgesic: Oxycodone/APAP 7.5/325 one tablet by mouth twice a day (15 mg/day) MME/day: 22.5 mg/day Pill Count: *** Pharmacokinetics: Onset of action (Liberation/Absorption): Within expected pharmacological parameters Time to Peak effect (Distribution): Timing and results are as within normal expected parameters Duration of action (Metabolism/Excretion): Within normal limits for medication Pharmacodynamics: Analgesic Effect: More than 50% Activity Facilitation: Medication(s) allow patient to sit, stand, walk, and do the basic ADLs Perceived Effectiveness: Described as relatively effective, allowing for increase in activities of daily living (ADL) Side-effects or Adverse reactions: None reported Monitoring: [place] PMP: Online review of the past 40-month period conducted. Compliant with practice rules and regulations Last UDS on record: Summary  Date Value Ref Range Status  02/21/2016 FINAL  Final    Comment:    ==================================================================== TOXAS[facility]RE COMP DRUG ANALYSIS,UR ==================================================================== Test                             Result       Flag       Units Drug Present and Declared for Prescription Verification   Paroxetine  PRESENT       EXPECTED Drug Present not Declared for Prescription Verification   Carboxy-THC                    48           UNEXPECTED ng/mg creat    Carboxy-THC is a metabolite of tetrahydrocannabinol  (THC).    Source of [facility] is most commonly illicit, but THC is also present    in a scheduled prescription medication. Drug Absent but Declared for Prescription Verification   Clonazepam                     Not Detected UNEXPECTED ng/mg creat   Oxycodone                      Not Detected UNEXPECTED ng/mg creat   Tramadol                       Not Detected UNEXPECTED   Acetaminophen                  Not Detected UNEXPECTED    Acetaminophen, as indicated in the declared medication list, is    not always detected even when used as directed.   Ibuprofen                      Not Detected UNEXPECTED    Ibuprofen, as indicated in the declared medication list, is not    always detected even when used as directed.   Lidocaine                      Not Detected UNEXPECTED    Lidocaine, as indicated in the declared medication list, is not    always detected even when used as directed. ==================================================================== Test                      Result    Flag   Units      Ref Range   Creatinine              183              mg/dL      >=20 ==================================================================== Declared Medications:  The flagging and interpretation on this report are based on the  following declared medications.  Unexpected results may arise from  inaccuracies in the declared medications.  **Note: The testing scope of this panel includes these medications:  Clonazepam  Oxycodone (Oxycodone Acetaminophen)  Paroxetine  Tramadol  **Note: The testing scope of this panel does not include small to  moderate amounts of these reported medications:  Acetaminophen (Oxycodone Acetaminophen)  Ibuprofen  Lidocaine  **Note: The testing scope of this panel does not include  following  reported medications:  Hydrochlorothiazide  Lisinopril  Meloxicam ==================================================================== For clinical consultation, please call [phone]. ====================================================================    UDS interpretation: Non-Compliant          Medication Assessment Form: Reviewed. Patient indicates being compliant with therapy Treatment compliance: Not applicable yet Risk Assessment: Aberrant Behavior: None observed today Substance Use Disorder (SUD) Risk Level: Very High Risk of opioid abuse or dependence: 0.7-3.0% with doses ? 36 MME/day and 6.1-26% with doses ? 120 MME/day. Opioid Risk Tool (ORT) Score:      Depression Scale Score: PHQ-2:         PHQ-9:  Pharmacologic Plan: Due to the results of the UDS and the patient's noncompliance with the ordered labs, we will not be prescribing any opioids for this patient.  Previous Illicit Drug Screen Labs(s): No results found for: MDMA, COCAINSCRNUR, PCPSCRNUR, [place]  Laboratory Chemistry  Inflammation Markers No results found for: ESRSEDRATE, CRP  Renal Function Lab Results  Component Value Date   BUN 16 08/30/2015   CREATININE 0.62 08/30/2015   GFRAA >60 08/30/2015   GFRNONAA >60 08/30/2015    Hepatic Function Lab Results  Component Value Date   AST 21 05/30/2015   ALT 25 05/30/2015   ALBUMIN 4.0 05/30/2015    Electrolytes Lab Results  Component Value Date   NA 141 08/30/2015   K 3.5 08/30/2015   CL 104 08/30/2015   CALCIUM 9.1 08/30/2015    Pain Modulating Vitamins No results found for: Maralyn Sago [id number], [id number], [id number], 25OHVITD1, 25OHVITD2, 25OHVITD3, VITAMINB12  Coagulation Parameters Lab Results  Component Value Date   PLT 227 08/30/2015    Cardiovascular Lab Results  Component Value Date   HGB 15.2 08/30/2015   HCT 45.7 08/30/2015    Note: Lab results reviewed.  Recent Diagnostic Imaging  US  Scrotum Result Date: 08/31/2015 CLINICAL DATA:  Right-sided testicular pain for 2 days. EXAM: SCROTAL ULTRASOUND DOPPLER ULTRASOUND OF THE TESTICLES TECHNIQUE: Complete ultrasound examination of the testicles, epididymis, and other scrotal structures was performed. Color and spectral Doppler ultrasound were also utilized to evaluate blood flow to the testicles. COMPARISON:  CT abdomen and pelvis 08/31/2015 FINDINGS: Right testicle Measurements: 4.2 x 2.3 x 2.4 cm. No mass or microlithiasis visualized. Left testicle Measurements: 3.8 x 2.1 x 2.3 cm. No mass or microlithiasis visualized. Right epididymis:  Normal in size and appearance. Left epididymis:  Normal in size and appearance. Hydrocele:  Small bilateral hydroceles. Varicocele:  None visualized. Pulsed Doppler interrogation of both testes demonstrates normal low resistance arterial and venous waveforms bilaterally. Homogeneous and symmetrical flow is demonstrated in both testes and epididymides bilaterally. Mildly prominent flow to the epididymal tail regions bilaterally is symmetrical and likely within normal limits. IMPRESSION: Normal ultrasound appearance of the testicles bilaterally. No evidence of testicular mass or torsion. Minimal bilateral hydroceles. Electronically Signed   By: Lucienne Capers M.D.   On: 08/31/2015 03:42   [place] Art/ven Flow Abd Pelv Doppler Result Date: 08/31/2015 CLINICAL DATA:  Right-sided testicular pain for 2 days. EXAM: SCROTAL ULTRASOUND DOPPLER ULTRASOUND OF THE TESTICLES TECHNIQUE: Complete ultrasound examination of the testicles, epididymis, and other scrotal structures was performed. Color and spectral Doppler ultrasound were also utilized to evaluate blood flow to the testicles. COMPARISON:  CT abdomen and pelvis 08/31/2015 FINDINGS: Right testicle Measurements: 4.2 x 2.3 x 2.4 cm. No mass or microlithiasis visualized. Left testicle Measurements: 3.8 x 2.1 x 2.3 cm. No mass or microlithiasis visualized. Right epididymis:   Normal in size and appearance. Left epididymis:  Normal in size and appearance. Hydrocele:  Small bilateral hydroceles. Varicocele:  None visualized. Pulsed Doppler interrogation of both testes demonstrates normal low resistance arterial and venous waveforms bilaterally. Homogeneous and symmetrical flow is demonstrated in both testes and epididymides bilaterally. Mildly prominent flow to the epididymal tail regions bilaterally is symmetrical and likely within normal limits. IMPRESSION: Normal ultrasound appearance of the testicles bilaterally. No evidence of testicular mass or torsion. Minimal bilateral hydroceles. Electronically Signed   By: Lucienne Capers M.D.   On: 08/31/2015 03:42   Ct Renal Stone Study Result Date: 08/31/2015 CLINICAL DATA:  Initial evaluation for acute  right flank pain. EXAM: CT ABDOMEN AND PELVIS WITHOUT CONTRAST TECHNIQUE: Multidetector CT imaging of the abdomen and pelvis was performed following the standard protocol without IV contrast. COMPARISON:  Prior study from 05/30/2015. FINDINGS: Minimal subsegmental atelectasis present within the visualized lung bases. The visualized lungs are otherwise clear. Liver demonstrates a normal unenhanced appearance. Gallbladder surgically absent. No biliary dilatation. Spleen, adrenal glands, and pancreas demonstrate a normal unenhanced appearance. Kidneys equal in size without evidence of nephrolithiasis or hydronephrosis. No radiopaque calculi seen along the course of either renal collecting system. There is no hydroureter. Possible tiny hypodense cystic lesion at the upper pole of the right kidney, not well evaluated on this exam. Stomach within normal limits. No evidence for bowel obstruction. Appendix well visualized in the right lower quadrant and is of normal caliber and appearance without associated inflammatory changes to suggest acute appendicitis. No abnormal wall thickening or inflammatory stranding seen elsewhere about the bowels.  Bladder within normal limits.  Prostate normal. No free air or fluid. No pathologically enlarged intra-abdominal or pelvic lymph nodes. No acute osseous abnormality. No worrisome lytic or blastic osseous lesions. IMPRESSION: 1. No CT evidence for nephrolithiasis or obstructive uropathy. 2. No other acute intra-abdominal or pelvic process. 3. Status post cholecystectomy. Electronically Signed   By: Jeannine Boga M.D.   On: 08/31/2015 01:33   Lumbosacral Imaging: Lumbar MR w/wo contrast:  Results for orders placed during the hospital encounter of 06/21/15  MR Lumbar Spine W Wo Contrast   Narrative CLINICAL DATA:  Midline low back pain for 1 month. Some radiation across the flanks but no radiation into the groin, buttocks, or legs.  EXAM: MRI LUMBAR SPINE WITHOUT AND WITH CONTRAST  TECHNIQUE: Multiplanar and multiecho pulse sequences of the lumbar spine were obtained without and with intravenous contrast.  CONTRAST:  9mL MULTIHANCE GADOBENATE DIMEGLUMINE 529 MG/ML IV SOLN  COMPARISON:  CT abdomen and pelvis 05/30/2015  FINDINGS: Image quality is degraded by patient body habitus. The lowest fully formed intervertebral disc space is labeled L5-S1.  There is slight right convex curvature of the lumbar spine. There is no listhesis. Vertebral body heights are preserved. At L3 vertebral body hemangioma is again seen. No vertebral marrow edema is identified. No suspicious enhancing lesion is seen. The conus medullaris is normal in signal and terminates at L1. Paraspinal soft tissues are unremarkable.  L1-2 through L3-4: No disc herniation or stenosis. At most minimal facet arthrosis.  L4-5: Disc desiccation with minimal disc space height loss. Mild circumferential disc bulging and minimal facet arthrosis result in moderate bilateral neural foraminal stenosis without spinal stenosis.  L5-S1:  Negative.  IMPRESSION: Single level disc degeneration at L4-5 with moderate  bilateral neural foraminal stenosis. No lumbar spinal stenosis.   Electronically Signed   By: Logan Bores M.D.   On: 06/21/2015 17:15    Knee Imaging: Knee-R MR wo contrast:  Results for orders placed during the hospital encounter of 11/07/14  MR Knee Right Wo Contrast   Narrative * PRIOR REPORT IMPORTED FROM AN EXTERNAL SYSTEM *   CLINICAL DATA:  RIGHT knee pain. Initial encounter. Fall. RIGHT knee  dislocation 2 weeks ago. Unable the weightbear.   EXAM:  MRI OF THE RIGHT KNEE WITHOUT CONTRAST   TECHNIQUE:  Multiplanar, multisequence MR imaging of the knee was performed. No  intravenous contrast was administered.   COMPARISON:  10/27/2014.   FINDINGS:  MENISCI   Medial meniscus:  Normal.   Lateral meniscus:  Normal.   LIGAMENTS  Cruciates:  Intact.   Collaterals:  Intact.   CARTILAGE   Patellofemoral: Age advanced severe chondromalacia patella. Grade IV  central lateral patellar facet chondromalacia with subchondral edema  and cystic changes. Lateral patellar tilt. Findings are compatible  with excessive lateral pressure syndrome. Trochlear cartilage  preserved by comparison. Tibial tuberosity to trochlear groove  distance borderline at 16 mm. Shallow trochlear groove.   Medial:  Normal.   Lateral:  Normal.   Joint:  Small effusion.   Popliteal Fossa:  Small uncomplicated Baker's cyst.   Extensor Mechanism: Quadriceps and patellar tendons intact. Scarring  is present along the lateral patellar retinaculum suggesting prior  release. Medial patellofemoral retinaculum appears normal.   Bones: Developing intraosseous ganglion in the medial aspect of the  posterior tibial plateau.   IMPRESSION:  1. 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  2. Susceptibility artifact along the lateral patellar retinaculum  suggesting previous release.  3. No findings of recent transient lateral patellar  dislocation.    Electronically Signed    By: Dereck Ligas M.D.    On: 11/07/2014 14:48       Knee-R DG 1-2 views:  Results for orders placed in visit on 10/27/14  DG Knee 1-2 Views Right   Narrative * PRIOR REPORT IMPORTED FROM AN EXTERNAL SYSTEM *   CLINICAL DATA:  Fall, twisting injury right knee. Knee cap move data  place. Patient put back in place.   EXAM:  RIGHT KNEE - 1-2 VIEW   COMPARISON:  None.   FINDINGS:  Mild degenerative changes within the right knee, most pronounced in  the patellofemoral compartment. No fracture, subluxation or  dislocation. Patella located in expected position. No joint  effusion.   IMPRESSION:  Mild degenerative changes.  No acute bony abnormality.    Electronically Signed    By: Rolm Baptise M.D.    On: 10/27/2014 23:44       Note: Imaging results reviewed.  Meds  The patient has a current medication list which includes the following prescription(s): clonazepam, diclofenac sodium, lidocaine hcl, lisinopril-hydrochlorothiazide, paroxetine, acetaminophen, clonazepam, cyclobenzaprine, hydrochlorothiazide, ibuprofen, lisinopril, meloxicam, oxycodone-acetaminophen, oxycodone-acetaminophen, and paroxetine hcl.  Current Outpatient Prescriptions on File Prior to Visit  Medication Sig  . clonazePAM (KLONOPIN) 1 MG tablet Take 1 mg by mouth 2 (two) times daily as needed for anxiety.  . cyclobenzaprine (FLEXERIL) 10 MG tablet Take 1 tablet (10 mg total) by mouth 2 (two) times daily.  . hydrochlorothiazide (HYDRODIURIL) 25 MG tablet Take 25 mg by mouth daily.  Marland Kitchen ibuprofen (ADVIL,MOTRIN) 200 MG tablet Take 800 mg by mouth 2 (two) times daily. 2 tabs every 4 hours  . lisinopril (PRINIVIL,ZESTRIL) 20 MG tablet Take 20 mg by mouth 2 (two) times daily.  . meloxicam (MOBIC) 7.5 MG tablet Take 1 tablet (7.5 mg total) by mouth 2 (two) times daily after a meal.  . oxyCODONE-acetaminophen (PERCOCET) 7.5-325 MG tablet Take 1 tablet by mouth 2 (two)  times daily.  Marland Kitchen PARoxetine HCl (PAXIL PO) Take 60 mg by mouth daily.    No current facility-administered medications on file prior to visit.     ROS  Constitutional: Denies any fever or chills Gastrointestinal: No reported hemesis, hematochezia, vomiting, or acute GI distress Musculoskeletal: Denies any acute onset joint swelling, redness, loss of ROM, or weakness Neurological: No reported episodes of acute onset apraxia, aphasia, dysarthria, agnosia, amnesia, paralysis, loss of coordination, or loss of consciousness  Allergies  Mr. Sokolowski is allergic  to ciprofloxacin.  [place]  Medical:  Mr. Arroyos  has a past medical history of Back pain at L4-L5 level (09/12/2015); DDD (degenerative disc disease), lumbar (09/12/2015); Hypertension; Kidney stones; Nerve root pain (01/31/2016); Radiculopathy of lumbosacral region (09/12/2015); and Status post spinal surgery (09/12/2015). Family: family history includes Atrial fibrillation in his mother; Cancer in his father. Surgical:  has a past surgical history that includes Cholecystectomy; Knee surgery (Right); and Abdominal exploration surgery. Tobacco:  reports that he has quit smoking. He does not have any smokeless tobacco history on file. Alcohol:  reports that he does not drink alcohol. Drug:  reports that he does not use drugs.  Constitutional Exam  Vitals: There were no vitals taken for this visit. General appearance: Well nourished, well developed, and well hydrated. In no acute distress Calculated BMI/Body habitus: There is no height or weight on file to calculate BMI.       Psych/Mental status: Alert and oriented x 3 (person, place, & time) Eyes: PERLA Respiratory: No evidence of acute respiratory distress  Cervical Spine Exam  Inspection: No masses, redness, or swelling Alignment: Symmetrical Functional ROM: ROM appears unrestricted Stability: No instability detected Muscle strength & Tone: Functionally intact Sensory:  Unimpaired Palpation: Non-contributory  Upper Extremity (UE) Exam    Side: Right upper extremity  Side: Left upper extremity  Inspection: No masses, redness, swelling, or asymmetry  Inspection: No masses, redness, swelling, or asymmetry  Functional ROM: ROM appears unrestricted  Functional ROM: ROM appears unrestricted  Muscle strength & Tone: Functionally intact  Muscle strength & Tone: Functionally intact  Sensory: Unimpaired  Sensory: Unimpaired  Palpation: Non-contributory  Palpation: Non-contributory   Thoracic Spine Exam  Inspection: No masses, redness, or swelling Alignment: Symmetrical Functional ROM: ROM appears unrestricted Stability: No instability detected Sensory: Unimpaired Muscle strength & Tone: Functionally intact Palpation: Non-contributory  Lumbar Spine Exam  Inspection: No masses, redness, or swelling Alignment: Symmetrical Functional ROM: ROM appears unrestricted Stability: No instability detected Muscle strength & Tone: Functionally intact Sensory: Unimpaired Palpation: Non-contributory Provocative Tests: Lumbar Hyperextension and rotation test: evaluation deferred today       Patrick's Maneuver: evaluation deferred today              Gait & Posture Assessment  Ambulation: Unassisted Gait: Relatively normal for age and body habitus Posture: WNL   Lower Extremity Exam    Side: Right lower extremity  Side: Left lower extremity  Inspection: No masses, redness, swelling, or asymmetry  Inspection: No masses, redness, swelling, or asymmetry  Functional ROM: ROM appears unrestricted  Functional ROM: ROM appears unrestricted  Muscle strength & Tone: Functionally intact  Muscle strength & Tone: Functionally intact  Sensory: Unimpaired  Sensory: Unimpaired  Palpation: Non-contributory  Palpation: Non-contributory    Assessment & Plan  Primary Diagnosis & Pertinent Problem List: The primary encounter diagnosis was Chronic pain. Diagnoses of Chronic low back  pain (Location of Primary Source of Pain) (Bilateral) (L>R), Chronic pain of lower extremity, unspecified laterality, Chronic knee pain (Location of Tertiary source of pain) (Bilateral) (R>L), Chronic hip pain (Left), Chronic sacroiliac joint pain (Right), Lumbar facet syndrome (Location of Primary Source of Pain) (Bilateral) (L>R), Lumbar foraminal stenosis at L4-5 (Bilateral), Lumbar spondylosis, unspecified spinal osteoarthritis, Long term current use of opiate analgesic, Opiate use (22.5 MME/Day), Long term prescription benzodiazepine use, and Marijuana use were also pertinent to this visit.  Visit Diagnosis: 1. Chronic pain   2. Chronic low back pain (Location of Primary Source of Pain) (Bilateral) (L>R)  3. Chronic pain of lower extremity, unspecified laterality   4. Chronic knee pain (Location of Tertiary source of pain) (Bilateral) (R>L)   5. Chronic hip pain (Left)   6. Chronic sacroiliac joint pain (Right)   7. Lumbar facet syndrome (Location of Primary Source of Pain) (Bilateral) (L>R)   8. Lumbar foraminal stenosis at L4-5 (Bilateral)   9. Lumbar spondylosis, unspecified spinal osteoarthritis   10. Long term current use of opiate analgesic   11. Opiate use (22.5 MME/Day)   12. Long term prescription benzodiazepine use   13. Marijuana use     Problems updated and reviewed during this visit: Problem  Chronic hip pain (Left)  Chronic sacroiliac joint pain (Right)  Long Term Prescription Benzodiazepine Use  Marijuana Use    Problem-specific Plan(s): No problem-specific Assessment & Plan notes found for this encounter.  No new Assessment & Plan notes have been filed under this hospital service since the last note was generated. Service: Pain Management   Plan of Care   Problem List Items Addressed This Visit      High   Chronic hip pain (Left) (Chronic)   Chronic knee pain (Location of Tertiary source of pain) (Bilateral) (R>L) (Chronic)   Chronic low back pain  (Location of Primary Source of Pain) (Bilateral) (L>R) (Chronic)   Relevant Medications   acetaminophen (TYLENOL) 325 MG tablet   oxyCODONE-acetaminophen (PERCOCET) 7.5-325 MG tablet   Chronic lower extremity pain (Location of Secondary source of pain) (Bilateral) (R>L) (Chronic)   Chronic pain - Primary (Chronic)   Relevant Medications   acetaminophen (TYLENOL) 325 MG tablet   clonazePAM (KLONOPIN) 1 MG tablet   oxyCODONE-acetaminophen (PERCOCET) 7.5-325 MG tablet   PARoxetine (PAXIL) 40 MG tablet   Chronic sacroiliac joint pain (Right) (Chronic)   Relevant Medications   acetaminophen (TYLENOL) 325 MG tablet   oxyCODONE-acetaminophen (PERCOCET) 7.5-325 MG tablet   Lumbar facet syndrome (Location of Primary Source of Pain) (Bilateral) (L>R) (Chronic)   Relevant Medications   acetaminophen (TYLENOL) 325 MG tablet   oxyCODONE-acetaminophen (PERCOCET) 7.5-325 MG tablet   Lumbar foraminal stenosis at L4-5 (Bilateral) (Chronic)   Lumbar spondylosis (Chronic)   Relevant Medications   acetaminophen (TYLENOL) 325 MG tablet   oxyCODONE-acetaminophen (PERCOCET) 7.5-325 MG tablet     Medium   Long term current use of opiate analgesic (Chronic)   Long term prescription benzodiazepine use (Chronic)   Marijuana use   Opiate use (22.5 MME/Day) (Chronic)    Other Visit Diagnoses   None.      Pharmacotherapy (Medications Ordered): No orders of the defined types were placed in this encounter.   Lab-work & Procedure Ordered: No orders of the defined types were placed in this encounter.   Imaging Ordered: None  Interventional Therapies: Scheduled:  None at this time.    Considering:  Diagnostic bilateral lumbar facet block under fluoroscopic guidance and IV sedation. Possible bilateral lumbar facet radiofrequency ablation under fluoroscopic guidance and IV sedation the pending on the results of the diagnostic injection. Diagnostic left sided intra-articular hip joint injection under  fluoroscopic guidance, with or without sedation. Possible left-sided hip joint radiofrequency ablation under fluoroscopic guidance and IV sedation the pending on the results of the diagnostic injection. Diagnostic right-sided sacroiliac joint injection under fluoroscopic guidance, with or without sedation. Possible right-sided sacroiliac joint radiofrequency ablation under fluoroscopic guidance and IV sedation, the pending on the results of the diagnostic injection. Diagnostic bilateral intra-articular knee injection, without fluoroscopic guidance or IV sedation. Possible series of 5  intra-articular Hyalgan knee injections, without fluoroscopy or IV sedation. Diagnostic bilateral Genicular nerve block under fluoroscopic guidance and IV sedation. Possible bilateral Genicular nerve radiofrequency ablation under fluoroscopic guidance and IV sedation the pending on the results of the diagnostic injection.   PRN Procedures:  None at this time.    Referral(s) or Consult(s): None at this time.  New Prescriptions   No medications on file    Medications administered during this visit: Mr. Pirolli had no medications administered during this visit.  Requested PM Follow-up: No Follow-up on file.  Future Appointments Date Time Provider [place]  03/18/2016 8:20 AM Milinda Pointer, MD [facility] None    Primary Care Physician: Tracie Harrier, MD Location: [facility] Note by: Kathlen Brunswick. Dossie Arbour, M.D, DABA, DABAPM, DABPM, DABIPP, FIPP  Pain Score Disclaimer: We use the NRS-11 scale. This is a self-reported, subjective measurement of pain severity with only modest accuracy. It is used primarily to identify changes within a particular patient. It must be understood that outpatient pain scales are significantly less accurate that those used for research, where they can be applied under ideal controlled circumstances with minimal exposure to variables. In  reality, the score is likely to be a combination of pain intensity and pain affect, where pain affect describes the degree of emotional arousal or changes in action readiness caused by the sensory experience of pain. Factors such as social and work situation, setting, emotional state, anxiety levels, expectation, and prior pain experience may influence pain perception and show large inter-individual differences that may also be affected by time variables.  Patient instructions provided during this appointment: There are no Patient Instructions on file for this visit.

## 2016-03-17 NOTE — Telephone Encounter (Signed)
Explained to patient why Dr. Dossie Arbour want some time after new patient appointment until follow-up. Offered appointment 03-18-16 at 8:20. Patient accepted appt.

## 2016-03-18 ENCOUNTER — Encounter: Payer: Self-pay | Admitting: Pain Medicine

## 2016-03-18 ENCOUNTER — Ambulatory Visit: Payer: BLUE CROSS/BLUE SHIELD | Attending: Pain Medicine | Admitting: Pain Medicine

## 2016-03-18 ENCOUNTER — Ambulatory Visit (HOSPITAL_BASED_OUTPATIENT_CLINIC_OR_DEPARTMENT_OTHER): Payer: BLUE CROSS/BLUE SHIELD | Admitting: Pain Medicine

## 2016-03-18 VITALS — BP 126/72 | HR 76 | Temp 98.5°F | Resp 18 | Ht 71.0 in | Wt 275.0 lb

## 2016-03-18 DIAGNOSIS — N200 Calculus of kidney: Secondary | ICD-10-CM | POA: Diagnosis not present

## 2016-03-18 DIAGNOSIS — F1210 Cannabis abuse, uncomplicated: Secondary | ICD-10-CM

## 2016-03-18 DIAGNOSIS — G4730 Sleep apnea, unspecified: Secondary | ICD-10-CM | POA: Diagnosis not present

## 2016-03-18 DIAGNOSIS — Z6841 Body Mass Index (BMI) 40.0 and over, adult: Secondary | ICD-10-CM | POA: Insufficient documentation

## 2016-03-18 DIAGNOSIS — M5136 Other intervertebral disc degeneration, lumbar region: Secondary | ICD-10-CM | POA: Diagnosis not present

## 2016-03-18 DIAGNOSIS — M25552 Pain in left hip: Secondary | ICD-10-CM

## 2016-03-18 DIAGNOSIS — F418 Other specified anxiety disorders: Secondary | ICD-10-CM | POA: Diagnosis not present

## 2016-03-18 DIAGNOSIS — M25561 Pain in right knee: Secondary | ICD-10-CM | POA: Diagnosis not present

## 2016-03-18 DIAGNOSIS — Z9049 Acquired absence of other specified parts of digestive tract: Secondary | ICD-10-CM | POA: Diagnosis not present

## 2016-03-18 DIAGNOSIS — D352 Benign neoplasm of pituitary gland: Secondary | ICD-10-CM | POA: Insufficient documentation

## 2016-03-18 DIAGNOSIS — G8929 Other chronic pain: Secondary | ICD-10-CM

## 2016-03-18 DIAGNOSIS — M47816 Spondylosis without myelopathy or radiculopathy, lumbar region: Secondary | ICD-10-CM

## 2016-03-18 DIAGNOSIS — Z79891 Long term (current) use of opiate analgesic: Secondary | ICD-10-CM | POA: Diagnosis not present

## 2016-03-18 DIAGNOSIS — Z87891 Personal history of nicotine dependence: Secondary | ICD-10-CM | POA: Diagnosis not present

## 2016-03-18 DIAGNOSIS — F1290 Cannabis use, unspecified, uncomplicated: Secondary | ICD-10-CM | POA: Diagnosis not present

## 2016-03-18 DIAGNOSIS — Z79899 Other long term (current) drug therapy: Secondary | ICD-10-CM

## 2016-03-18 DIAGNOSIS — M79606 Pain in leg, unspecified: Secondary | ICD-10-CM

## 2016-03-18 DIAGNOSIS — M2241 Chondromalacia patellae, right knee: Secondary | ICD-10-CM | POA: Diagnosis not present

## 2016-03-18 DIAGNOSIS — N433 Hydrocele, unspecified: Secondary | ICD-10-CM | POA: Insufficient documentation

## 2016-03-18 DIAGNOSIS — I10 Essential (primary) hypertension: Secondary | ICD-10-CM | POA: Insufficient documentation

## 2016-03-18 DIAGNOSIS — M791 Myalgia: Secondary | ICD-10-CM

## 2016-03-18 DIAGNOSIS — M545 Low back pain: Secondary | ICD-10-CM

## 2016-03-18 DIAGNOSIS — M25562 Pain in left knee: Secondary | ICD-10-CM | POA: Diagnosis not present

## 2016-03-18 DIAGNOSIS — E7800 Pure hypercholesterolemia, unspecified: Secondary | ICD-10-CM | POA: Diagnosis not present

## 2016-03-18 DIAGNOSIS — F1190 Opioid use, unspecified, uncomplicated: Secondary | ICD-10-CM

## 2016-03-18 DIAGNOSIS — M4806 Spinal stenosis, lumbar region: Secondary | ICD-10-CM

## 2016-03-18 DIAGNOSIS — M533 Sacrococcygeal disorders, not elsewhere classified: Secondary | ICD-10-CM

## 2016-03-18 DIAGNOSIS — M7918 Myalgia, other site: Secondary | ICD-10-CM

## 2016-03-18 DIAGNOSIS — K219 Gastro-esophageal reflux disease without esophagitis: Secondary | ICD-10-CM | POA: Diagnosis not present

## 2016-03-18 DIAGNOSIS — M48061 Spinal stenosis, lumbar region without neurogenic claudication: Secondary | ICD-10-CM

## 2016-03-18 MED ORDER — METHOCARBAMOL 750 MG PO TABS: 750.0000 mg | ORAL_TABLET | Freq: Three times a day (TID) | ORAL | 2 refills | Status: DC | PRN

## 2016-03-18 MED ORDER — MELOXICAM 7.5 MG PO TABS: 7.5000 mg | ORAL_TABLET | Freq: Two times a day (BID) | ORAL | 2 refills | Status: AC

## 2016-03-18 NOTE — Patient Instructions (Signed)
Epidural Steroid Injection Patient Information  Description: The epidural space surrounds the nerves as they exit the spinal cord.  In some patients, the nerves can be compressed and inflamed by a bulging disc or a tight spinal canal (spinal stenosis).  By injecting steroids into the epidural space, we can bring irritated nerves into direct contact with a potentially helpful medication.  These steroids act directly on the irritated nerves and can reduce swelling and inflammation which often leads to decreased pain.  Epidural steroids may be injected anywhere along the spine and from the neck to the low back depending upon the location of your pain.   After numbing the skin with local anesthetic (like Novocaine), a small needle is passed into the epidural space slowly.  You may experience a sensation of pressure while this is being done.  The entire block usually last less than 10 minutes.  Conditions which may be treated by epidural steroids:   Low back and leg pain  Neck and arm pain  Spinal stenosis  Post-laminectomy syndrome  Herpes zoster (shingles) pain  Pain from compression fractures  Preparation for the injection:  1. Do not eat any solid food or dairy products within 8 hours of your appointment.  2. You may drink clear liquids up to 3 hours before appointment.  Clear liquids include water, black coffee, juice or soda.  No milk or cream please. 3. You may take your regular medication, including pain medications, with a sip of water before your appointment  Diabetics should hold regular insulin (if taken separately) and take 1/2 normal NPH dos the morning of the procedure.  Carry some sugar containing items with you to your appointment. 4. A driver must accompany you and be prepared to drive you home after your procedure.  5. Bring all your current medications with your. 6. An IV may be inserted and sedation may be given at the discretion of the physician.   7. A blood pressure  cuff, EKG and other monitors will often be applied during the procedure.  Some patients may need to have extra oxygen administered for a short period. 8. You will be asked to provide medical information, including your allergies, prior to the procedure.  We must know immediately if you are taking blood thinners (like Coumadin/Warfarin)  Or if you are allergic to IV iodine contrast (dye). We must know if you could possible be pregnant.  Possible side-effects:  Bleeding from needle site  Infection (rare, may require surgery)  Nerve injury (rare)  Numbness & tingling (temporary)  Difficulty urinating (rare, temporary)  Spinal headache ( a headache worse with upright posture)  Light -headedness (temporary)  Pain at injection site (several days)  Decreased blood pressure (temporary)  Weakness in arm/leg (temporary)  Pressure sensation in back/neck (temporary)  Call if you experience:  Fever/chills associated with headache or increased back/neck pain.  Headache worsened by an upright position.  New onset weakness or numbness of an extremity below the injection site  Hives or difficulty breathing (go to the emergency room)  Inflammation or drainage at the infection site  Severe back/neck pain  Any new symptoms which are concerning to you  Please note:  Although the local anesthetic injected can often make your back or neck feel good for several hours after the injection, the pain will likely return.  It takes 3-7 days for steroids to work in the epidural space.  You may not notice any pain relief for at least that one week.    If effective, we will often do a series of three injections spaced 3-6 weeks apart to maximally decrease your pain.  After the initial series, we generally will wait several months before considering a repeat injection of the same type.  If you have any questions, please call [phone] [facility] Patient Information  Description: The facets are joints in the spine between the vertebrae.  Like any joints in the body, facets can become irritated and painful.  Arthritis can also effect the facets.  By injecting steroids and local anesthetic in and around these joints, we can temporarily block the nerve supply to them.  Steroids act directly on irritated nerves and tissues to reduce selling and inflammation which often leads to decreased pain.  Facet blocks may be done anywhere along the spine from the neck to the low back depending upon the location of your pain.   After numbing the skin with local anesthetic (like Novocaine), a small needle is passed onto the facet joints under x-ray guidance.  You may experience a sensation of pressure while this is being done.  The entire block usually lasts about 15-25 minutes.   Conditions which may be treated by facet blocks:   Low back/buttock pain  Neck/shoulder pain  Certain types of headaches  Preparation for the injection:  12. Do not eat any solid food or dairy products within 8 hours of your appointment. 13. You may drink clear liquid up to 3 hours before appointment.  Clear liquids include water, black coffee, juice or soda.  No milk or cream please. 14. You may take your regular medication, including pain medications, with a sip of water before your appointment.  Diabetics should hold regular insulin (if taken separately) and take 1/2 normal NPH dose the morning of the procedure.  Carry some sugar containing items with you to your appointment. 15. A driver must accompany you and be prepared to drive you home after your procedure. 49. Bring all your current medications with you. 17. An IV may be inserted and sedation may be given at the discretion of the physician. 18. A blood pressure cuff, EKG and other monitors will often be applied during the procedure.  Some patients may need to have extra oxygen administered for a short  period. 42. You will be asked to provide medical information, including your allergies and medications, prior to the procedure.  We must know immediately if you are taking blood thinners (like Coumadin/Warfarin) or if you are allergic to IV iodine contrast (dye).  We must know if you could possible be pregnant.  Possible side-effects:   Bleeding from needle site  Infection (rare, may require surgery)  Nerve injury (rare)  Numbness & tingling (temporary)  Difficulty urinating (rare, temporary)  Spinal headache (a headache worse with upright posture)  Light-headedness (temporary)  Pain at injection site (serveral days)  Decreased blood pressure (rare, temporary)  Weakness in arm/leg (temporary)  Pressure sensation in back/neck (temporary)   Call if you experience:   Fever/chills associated with headache or increased back/neck pain  Headache worsened by an upright position  New onset, weakness or numbness of an extremity below the injection site  Hives or difficulty breathing (go to the emergency room)  Inflammation or drainage at the injection site(s)  Severe back/neck pain greater than usual  New symptoms which are concerning to you  Please note:  Although the local anesthetic injected can often make your back or neck feel good for  several hours after the injection, the pain will likely return. It takes 3-7 days for steroids to work.  You may not notice any pain relief for at least one week.  If effective, we will often do a series of 2-3 injections spaced 3-6 weeks apart to maximally decrease your pain.  After the initial series, you may be a candidate for a more permanent nerve block of the facets.  If you have any questions, please call #[phone]) [facility]

## 2016-03-18 NOTE — Progress Notes (Signed)
Patient's Name: Gerald Harris  Patient type: Established  MRN: [id number]  Service setting: Ambulatory outpatient  DOB: 07/01/1971  Location: [facility]  DOS: 03/18/2016  Primary Care Physician: Tracie Harrier, MD  Note by: Kathlen Brunswick. Dossie Arbour, M.D, DABA, DABAPM, DABPM, DABIPP, FIPP  Referring Physician: Tracie Harrier, MD  Specialty: Board-Certified Interventional Pain Management  Last Visit to Pain Management: 03/17/2016   Primary Reason(s) for Visit: Encounter for evaluation before starting new chronic pain management plan of care (Level of risk: moderate) CC: Back Pain (lower)   HPI  Mr. Andelin is a 45 y.o. year old, male patient, who returns today as an established patient. He has Body mass index (BMI) of 40.0-44.9 in adult [facility]); Clinical depression; Anxiety, generalized; Acid reflux; Hypercholesterolemia; BP (high blood pressure); Calculus of kidney; Apnea, sleep; Chronic pain; Chronic low back pain (Location of Primary Source of Pain) (Bilateral) (L>R); Chronic lower extremity pain (Location of Secondary source of pain) (Bilateral) (R>L); Lumbar spondylosis; Chronic knee pain (Location of Tertiary source of pain) (Bilateral) (R>L); Long term current use of opiate analgesic; Long term prescription opiate use; Opiate use (22.5 MME/Day); Encounter for therapeutic drug level monitoring; Encounter for pain management planning; Musculoskeletal pain; Chronic hip pain (Left); Chronic sacroiliac joint pain (Right); Discogenic low back pain; Lumbar facet syndrome (Location of Primary Source of Pain) (Bilateral) (L>R); Pituitary microadenoma ([place]); Chondromalacia patellae of knee (Right); 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 His primarily concern today is the Back Pain (lower)   Pain  Assessment: Self-Reported Pain Score: 5  (pain scale information given) Clinically the patient looks like a 2/10 Reported level is inconsistent with clinical obrservations Information on the proper use of the pain score provided to the patient today. Pain Type: Chronic pain Pain Location: Back Pain Orientation: Lower Pain Descriptors / Indicators: Numbness, Sharp Pain Frequency: Constant  The patient comes into the clinics today for post-procedure evaluation on the interventional treatment done on 02/21/2016. In addition, he comes in today for pharmacological management of his chronic pain.  The patient  reports that he does not use drugs. Today we spoke about the results of the UDS and my determination that I will not be writing for any opioids based on that and the fact that he did not have any of my blood work or x-rays done. Today he has requested that we put the order for those x-rays and lab testing and so that he can have it done. He does understand that this does not mean that he will be getting any controlled substances. Today we have renewed his meloxicam and we have discontinued the cyclobenzaprine due to the fact that it makes him "wired up", and we have substituted that for Robaxin 750 mg that he can take 1 tablet 3 times a day. The patient has also requested that we schedule him to come back for a diagnostic bilateral lumbar facet block under fluoroscopic guidance and IV sedation. Today we went over the injections done by Dr. Lance Bosch and how he indicates that it did not provide her with any long-term benefit. Dr. Idelia Salm did a series of bilateral transforaminal L4-5 epidural steroid injections as well as one caudal epidural steroid injection, none of which provided him with any long-term benefit.  Date of Last Visit: 02/21/16 Service Provided on Last Visit: Evaluation (new patient)  Controlled Substance Pharmacotherapy Assessment &  REMS (Risk Evaluation and Mitigation Strategy)   Analgesic: Oxycodone/APAP 7.5/325 one tablet by mouth twice a day (15 mg/day) MME/day: 22.5 mg/day Pill Count: The patient has not received any medications from our services. Pharmacokinetics: Onset of action (Liberation/Absorption): Within expected pharmacological parameters Time to Peak effect (Distribution): Timing and results are as within normal expected parameters Duration of action (Metabolism/Excretion): Within normal limits for medication Pharmacodynamics: Analgesic Effect: More than 50% Activity Facilitation: Medication(s) allow patient to sit, stand, walk, and do the basic ADLs Perceived Effectiveness: Described as relatively effective, allowing for increase in activities of daily living (ADL) Side-effects or Adverse reactions: None reported Monitoring: [place] PMP: Online review of the past 37-month period conducted. Compliant with practice rules and regulations Last UDS on record: Summary  Date Value Ref Range Status  02/21/2016 FINAL  Final    Comment:    ==================================================================== TOXASSURE COMP DRUG ANALYSIS,UR ==================================================================== Test                             Result       Flag       Units Drug Present and Declared for Prescription Verification   Paroxetine                     PRESENT      EXPECTED Drug Present not Declared for Prescription Verification   Carboxy-THC                    48           UNEXPECTED ng/mg creat    Carboxy-THC is a metabolite of tetrahydrocannabinol  (THC).    Source of [facility] is most commonly illicit, but THC is also present    in a scheduled prescription medication. Drug Absent but Declared for Prescription Verification   Clonazepam                     Not Detected UNEXPECTED ng/mg creat   Oxycodone                      Not Detected UNEXPECTED ng/mg creat   Tramadol                       Not Detected UNEXPECTED   Acetaminophen                  Not  Detected UNEXPECTED    Acetaminophen, as indicated in the declared medication list, is    not always detected even when used as directed.   Ibuprofen                      Not Detected UNEXPECTED    Ibuprofen, as indicated in the declared medication list, is not    always detected even when used as directed.   Lidocaine                      Not Detected UNEXPECTED    Lidocaine, as indicated in the declared medication list, is not    always detected even when used as directed. ==================================================================== Test                      Result    Flag   Units      Ref Range   Creatinine  183              mg/dL      >=20 ==================================================================== Declared Medications:  The flagging and interpretation on this report are based on the  following declared medications.  Unexpected results may arise from  inaccuracies in the declared medications.  **Note: The testing scope of this panel includes these medications:  Clonazepam  Oxycodone (Oxycodone Acetaminophen)  Paroxetine  Tramadol  **Note: The testing scope of this panel does not include small to  moderate amounts of these reported medications:  Acetaminophen (Oxycodone Acetaminophen)  Ibuprofen  Lidocaine  **Note: The testing scope of this panel does not include following  reported medications:  Hydrochlorothiazide  Lisinopril  Meloxicam ==================================================================== For clinical consultation, please call [phone]. ====================================================================    UDS interpretation: Non-Compliant The patient did not report using any illegal substances. In fact his questionnaire indicates that he was not. Medication Assessment Form: Reviewed. Patient indicates being compliant with therapy Treatment compliance: Not applicable yet Risk Assessment: Aberrant Behavior: None observed  today Substance Use Disorder (SUD) Risk Level: Low Risk of opioid abuse or dependence: 0.7-3.0% with doses ? 36 MME/day and 6.1-26% with doses ? 120 MME/day. Opioid Risk Tool (ORT) Score:  0 Low Risk for SUD (Score <3) (This would indicate that he denied taking any illegal substances, which in turn would indicate that he lied to [place].) Depression Scale Score: PHQ-2: PHQ-2 Total Score: 0 No depression (0) PHQ-9: PHQ-9 Total Score: 0 No depression (0-4)  Pharmacologic Plan: Today we may be taking over the patient's pharmacological regimen. See below  Previous Illicit Drug Screen Labs(s): No results found for: MDMA, COCAINSCRNUR, PCPSCRNUR, [place]  Laboratory Chemistry  Inflammation Markers No results found for: ESRSEDRATE, CRP  Renal Function Lab Results  Component Value Date   BUN 16 08/30/2015   CREATININE 0.62 08/30/2015   GFRAA >60 08/30/2015   GFRNONAA >60 08/30/2015    Hepatic Function Lab Results  Component Value Date   AST 21 05/30/2015   ALT 25 05/30/2015   ALBUMIN 4.0 05/30/2015    Electrolytes Lab Results  Component Value Date   NA 141 08/30/2015   K 3.5 08/30/2015   CL 104 08/30/2015   CALCIUM 9.1 08/30/2015    Pain Modulating Vitamins No results found for: [place], [id number], [id number], [id number], 25OHVITD1, 25OHVITD2, 25OHVITD3, VITAMINB12  Coagulation Parameters Lab Results  Component Value Date   PLT 227 08/30/2015    Cardiovascular Lab Results  Component Value Date   HGB 15.2 08/30/2015   HCT 45.7 08/30/2015    Note: The patient didn't does not do any of the blood work that I had ordered on the patient's initial visit.  Recent Diagnostic Imaging  US Scrotum  Result Date: 08/31/2015 CLINICAL DATA:  Right-sided testicular pain for 2 days. EXAM: SCROTAL ULTRASOUND DOPPLER ULTRASOUND OF THE TESTICLES TECHNIQUE: Complete ultrasound examination of the testicles, epididymis, and other scrotal structures was performed. Color and spectral Doppler  ultrasound were also utilized to evaluate blood flow to the testicles. COMPARISON:  CT abdomen and pelvis 08/31/2015 FINDINGS: Right testicle Measurements: 4.2 x 2.3 x 2.4 cm. No mass or microlithiasis visualized. Left testicle Measurements: 3.8 x 2.1 x 2.3 cm. No mass or microlithiasis visualized. Right epididymis:  Normal in size and appearance. Left epididymis:  Normal in size and appearance. Hydrocele:  Small bilateral hydroceles. Varicocele:  None visualized. Pulsed Doppler interrogation of both testes demonstrates normal low resistance arterial and venous waveforms bilaterally. Homogeneous and symmetrical flow is demonstrated in both  testes and epididymides bilaterally. Mildly prominent flow to the epididymal tail regions bilaterally is symmetrical and likely within normal limits. IMPRESSION: Normal ultrasound appearance of the testicles bilaterally. No evidence of testicular mass or torsion. Minimal bilateral hydroceles. Electronically Signed   By: Lucienne Capers M.D.   On: 08/31/2015 03:42   [place] Art/ven Flow Abd Pelv Doppler  Result Date: 08/31/2015 CLINICAL DATA:  Right-sided testicular pain for 2 days. EXAM: SCROTAL ULTRASOUND DOPPLER ULTRASOUND OF THE TESTICLES TECHNIQUE: Complete ultrasound examination of the testicles, epididymis, and other scrotal structures was performed. Color and spectral Doppler ultrasound were also utilized to evaluate blood flow to the testicles. COMPARISON:  CT abdomen and pelvis 08/31/2015 FINDINGS: Right testicle Measurements: 4.2 x 2.3 x 2.4 cm. No mass or microlithiasis visualized. Left testicle Measurements: 3.8 x 2.1 x 2.3 cm. No mass or microlithiasis visualized. Right epididymis:  Normal in size and appearance. Left epididymis:  Normal in size and appearance. Hydrocele:  Small bilateral hydroceles. Varicocele:  None visualized. Pulsed Doppler interrogation of both testes demonstrates normal low resistance arterial and venous waveforms bilaterally. Homogeneous and  symmetrical flow is demonstrated in both testes and epididymides bilaterally. Mildly prominent flow to the epididymal tail regions bilaterally is symmetrical and likely within normal limits. IMPRESSION: Normal ultrasound appearance of the testicles bilaterally. No evidence of testicular mass or torsion. Minimal bilateral hydroceles. Electronically Signed   By: Lucienne Capers M.D.   On: 08/31/2015 03:42   Ct Renal Stone Study  Result Date: 08/31/2015 CLINICAL DATA:  Initial evaluation for acute right flank pain. EXAM: CT ABDOMEN AND PELVIS WITHOUT CONTRAST TECHNIQUE: Multidetector CT imaging of the abdomen and pelvis was performed following the standard protocol without IV contrast. COMPARISON:  Prior study from 05/30/2015. FINDINGS: Minimal subsegmental atelectasis present within the visualized lung bases. The visualized lungs are otherwise clear. Liver demonstrates a normal unenhanced appearance. Gallbladder surgically absent. No biliary dilatation. Spleen, adrenal glands, and pancreas demonstrate a normal unenhanced appearance. Kidneys equal in size without evidence of nephrolithiasis or hydronephrosis. No radiopaque calculi seen along the course of either renal collecting system. There is no hydroureter. Possible tiny hypodense cystic lesion at the upper pole of the right kidney, not well evaluated on this exam. Stomach within normal limits. No evidence for bowel obstruction. Appendix well visualized in the right lower quadrant and is of normal caliber and appearance without associated inflammatory changes to suggest acute appendicitis. No abnormal wall thickening or inflammatory stranding seen elsewhere about the bowels. Bladder within normal limits.  Prostate normal. No free air or fluid. No pathologically enlarged intra-abdominal or pelvic lymph nodes. No acute osseous abnormality. No worrisome lytic or blastic osseous lesions. IMPRESSION: 1. No CT evidence for nephrolithiasis or obstructive uropathy. 2.  No other acute intra-abdominal or pelvic process. 3. Status post cholecystectomy. Electronically Signed   By: Jeannine Boga M.D.   On: 08/31/2015 01:33   Lumbosacral Imaging: Lumbar MR w/wo contrast:  Results for orders placed during the hospital encounter of 06/21/15  MR Lumbar Spine W Wo Contrast   Narrative CLINICAL DATA:  Midline low back pain for 1 month. Some radiation across the flanks but no radiation into the groin, buttocks, or legs.  EXAM: MRI LUMBAR SPINE WITHOUT AND WITH CONTRAST  TECHNIQUE: Multiplanar and multiecho pulse sequences of the lumbar spine were obtained without and with intravenous contrast.  CONTRAST:  1mL MULTIHANCE GADOBENATE DIMEGLUMINE 529 MG/ML IV SOLN  COMPARISON:  CT abdomen and pelvis 05/30/2015  FINDINGS: Image quality is degraded by patient  body habitus. The lowest fully formed intervertebral disc space is labeled L5-S1.  There is slight right convex curvature of the lumbar spine. There is no listhesis. Vertebral body heights are preserved. At L3 vertebral body hemangioma is again seen. No vertebral marrow edema is identified. No suspicious enhancing lesion is seen. The conus medullaris is normal in signal and terminates at L1. Paraspinal soft tissues are unremarkable.  L1-2 through L3-4: No disc herniation or stenosis. At most minimal facet arthrosis.  L4-5: Disc desiccation with minimal disc space height loss. Mild circumferential disc bulging and minimal facet arthrosis result in moderate bilateral neural foraminal stenosis without spinal stenosis.  L5-S1:  Negative.  IMPRESSION: Single level disc degeneration at L4-5 with moderate bilateral neural foraminal stenosis. No lumbar spinal stenosis.   Electronically Signed   By: Logan Bores M.D.   On: 06/21/2015 17:15    Knee Imaging: Knee-R MR wo contrast:  Results for orders placed during the hospital encounter of 11/07/14  MR Knee Right Wo Contrast   Narrative *  PRIOR REPORT IMPORTED FROM AN EXTERNAL SYSTEM *   CLINICAL DATA:  RIGHT knee pain. Initial encounter. Fall. RIGHT knee  dislocation 2 weeks ago. Unable the weightbear.   EXAM:  MRI OF THE RIGHT KNEE WITHOUT CONTRAST   TECHNIQUE:  Multiplanar, multisequence MR imaging of the knee was performed. No  intravenous contrast was administered.   COMPARISON:  10/27/2014.   FINDINGS:  MENISCI   Medial meniscus:  Normal.   Lateral meniscus:  Normal.   LIGAMENTS   Cruciates:  Intact.   Collaterals:  Intact.   CARTILAGE   Patellofemoral: Age advanced severe chondromalacia patella. Grade IV  central lateral patellar facet chondromalacia with subchondral edema  and cystic changes. Lateral patellar tilt. Findings are compatible  with excessive lateral pressure syndrome. Trochlear cartilage  preserved by comparison. Tibial tuberosity to trochlear groove  distance borderline at 16 mm. Shallow trochlear groove.   Medial:  Normal.   Lateral:  Normal.   Joint:  Small effusion.   Popliteal Fossa:  Small uncomplicated Baker's cyst.   Extensor Mechanism: Quadriceps and patellar tendons intact. Scarring  is present along the lateral patellar retinaculum suggesting prior  release. Medial patellofemoral retinaculum appears normal.   Bones: Developing intraosseous ganglion in the medial aspect of the  posterior tibial plateau.   IMPRESSION:  1. 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  2. Susceptibility artifact along the lateral patellar retinaculum  suggesting previous release.  3. No findings of recent transient lateral patellar dislocation.    Electronically Signed    By: Dereck Ligas M.D.    On: 11/07/2014 14:48       Knee-R DG 1-2 views:  Results for orders placed in visit on 10/27/14  DG Knee 1-2 Views Right   Narrative * PRIOR REPORT IMPORTED FROM AN EXTERNAL SYSTEM *   CLINICAL DATA:  Fall, twisting injury  right knee. Knee cap move data  place. Patient put back in place.   EXAM:  RIGHT KNEE - 1-2 VIEW   COMPARISON:  None.   FINDINGS:  Mild degenerative changes within the right knee, most pronounced in  the patellofemoral compartment. No fracture, subluxation or  dislocation. Patella located in expected position. No joint  effusion.   IMPRESSION:  Mild degenerative changes.  No acute bony abnormality.    Electronically Signed    By: Rolm Baptise M.D.    On: 10/27/2014 23:44  Note: The patient did not do any of the x-rays that I had ordered on his initial visit.  Meds  The patient has a current medication list which includes the following prescription(s): acetaminophen, clonazepam, ibuprofen, lisinopril-hydrochlorothiazide, meloxicam, paroxetine hcl, and methocarbamol.  Current Outpatient Prescriptions on File Prior to Visit  Medication Sig  . acetaminophen (TYLENOL) 325 MG tablet Take by mouth.  . clonazePAM (KLONOPIN) 1 MG tablet Take 1 mg by mouth 2 (two) times daily as needed for anxiety.  Marland Kitchen ibuprofen (ADVIL,MOTRIN) 200 MG tablet Take 400 mg by mouth as needed. 2 tabs every 4 hours   . lisinopril-hydrochlorothiazide (PRINZIDE,ZESTORETIC) 20-12.5 MG tablet 1 tab po twice a day  . PARoxetine HCl (PAXIL PO) Take 60 mg by mouth daily.    No current facility-administered medications on file prior to visit.     ROS  Constitutional: Denies any fever or chills Gastrointestinal: No reported hemesis, hematochezia, vomiting, or acute GI distress Musculoskeletal: Denies any acute onset joint swelling, redness, loss of ROM, or weakness Neurological: No reported episodes of acute onset apraxia, aphasia, dysarthria, agnosia, amnesia, paralysis, loss of coordination, or loss of consciousness  Allergies  Mr. Wydra is allergic to ciprofloxacin.  [place]  Medical:  Mr. Tulip  has a past medical history of Back pain at L4-L5 level (09/12/2015); DDD (degenerative disc disease), lumbar  (09/12/2015); Hypertension; Kidney stones; Nerve root pain (01/31/2016); Radiculopathy of lumbosacral region (09/12/2015); and Status post spinal surgery (09/12/2015). Family: family history includes Atrial fibrillation in his mother; Cancer in his father. Surgical:  has a past surgical history that includes Cholecystectomy; Knee surgery (Right); and Abdominal exploration surgery. Tobacco:  reports that he has quit smoking. He has never used smokeless tobacco. Alcohol:  reports that he does not drink alcohol. Drug:  reports that he does not use drugs.  Constitutional Exam  Vitals: Blood pressure 126/72, pulse 76, temperature 98.5 F (36.9 C), resp. rate 18, height 5\' 11"  (1.803 m), weight 275 lb (124.7 kg), SpO2 100 %. General appearance: Well nourished, well developed, and well hydrated. In no acute distress Calculated BMI/Body habitus: Body mass index is 38.35 kg/m. (35-39.9 kg/m2) Severe obesity (Class II) - 136% higher incidence of chronic pain Psych/Mental status: Alert and oriented x 3 (person, place, & time) Eyes: PERLA Respiratory: No evidence of acute respiratory distress  Cervical Spine Exam  Inspection: No masses, redness, or swelling Alignment: Symmetrical Functional ROM: ROM appears unrestricted Stability: No instability detected Muscle strength & Tone: Functionally intact Sensory: Unimpaired Palpation: Non-contributory  Upper Extremity (UE) Exam    Side: Right upper extremity  Side: Left upper extremity  Inspection: No masses, redness, swelling, or asymmetry  Inspection: No masses, redness, swelling, or asymmetry  Functional ROM: ROM appears unrestricted  Functional ROM: ROM appears unrestricted  Muscle strength & Tone: Functionally intact  Muscle strength & Tone: Functionally intact  Sensory: Unimpaired  Sensory: Unimpaired  Palpation: Non-contributory  Palpation: Non-contributory   Thoracic Spine Exam  Inspection: No masses, redness, or swelling Alignment:  Symmetrical Functional ROM: ROM appears unrestricted Stability: No instability detected Sensory: Unimpaired Muscle strength & Tone: Functionally intact Palpation: Non-contributory  Lumbar Spine Exam  Inspection: No masses, redness, or swelling Alignment: Symmetrical Functional ROM: Decreased ROM Stability: No instability detected Muscle strength & Tone: Functionally intact Sensory: Movement-associated pain Palpation: Complains of area being tender to palpation Provocative Tests: Lumbar Hyperextension and rotation test: Positive bilaterally for facet joint pain. Patrick's Maneuver: evaluation deferred today  Gait & Posture Assessment  Ambulation: Unassisted Gait: Relatively normal for age and body habitus Posture: WNL   Lower Extremity Exam    Side: Right lower extremity  Side: Left lower extremity  Inspection: No masses, redness, swelling, or asymmetry  Inspection: No masses, redness, swelling, or asymmetry  Functional ROM: ROM appears unrestricted  Functional ROM: ROM appears unrestricted  Muscle strength & Tone: Functionally intact  Muscle strength & Tone: Functionally intact  Sensory: Unimpaired  Sensory: Unimpaired  Palpation: Non-contributory  Palpation: Non-contributory    Assessment & Plan  Primary Diagnosis & Pertinent Problem List: The primary encounter diagnosis was Chronic low back pain (Location of Primary Source of Pain) (Bilateral) (L>R). 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  Visit Diagnosis: 1. Chronic low back pain (Location of Primary Source of Pain) (Bilateral) (L>R)   2. Chronic pain of lower extremity, unspecified laterality   3. Chronic knee pain (Location of Tertiary source of pain) (Bilateral) (R>L)   4. Chronic pain   5. Marijuana use   6. Opiate use (22.5 MME/Day)   7. Long term current use of opiate analgesic   8. Long term prescription benzodiazepine use   9. 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 Lumbar facet syndrome (Location of Primary Source of Pain) (Bilateral) (L>R)   11. Chronic low back pain   12. Lumbar spondylosis, unspecified spinal osteoarthritis   13. Discogenic low back pain   14. Chronic right sacroiliac joint pain   15. Chronic left hip pain   16. Musculoskeletal pain     Problems updated and reviewed during this visit: No problems updated.  Problem-specific Plan(s): No problem-specific Assessment & Plan notes found for this encounter.  No new Assessment & Plan notes have been filed under this hospital service since the last note was generated. Service: Pain Management   Plan of Care   Problem List Items Addressed This Visit      High   Chronic hip pain (Left) (Chronic)   Chronic knee pain (Location of Tertiary source of pain) (Bilateral) (R>L) (Chronic)   Relevant Orders   KNEE INJECTION   Chronic low back pain (Location of Primary Source of Pain) (Bilateral) (L>R) - Primary (Chronic)   Relevant Medications   meloxicam (MOBIC) 7.5 MG tablet   methocarbamol (ROBAXIN) 750 MG tablet   Other Relevant Orders   LUMBAR FACET(MEDIAL BRANCH NERVE BLOCK) MBNB   LUMBAR FACET(MEDIAL BRANCH NERVE BLOCK) MBNB   Chronic lower extremity pain (Location of Secondary source of pain) (Bilateral) (R>L) (Chronic)   Relevant Orders   Lumbar Transforaminal Epidural   Chronic pain (Chronic)   Relevant  Medications   meloxicam (MOBIC) 7.5 MG tablet   methocarbamol (ROBAXIN) 750 MG tablet   Chronic sacroiliac joint pain (Right) (Chronic)   Relevant Medications   meloxicam (MOBIC) 7.5 MG tablet   methocarbamol (ROBAXIN) 750 MG tablet   Discogenic low back pain (Chronic)   Relevant Medications   meloxicam (MOBIC) 7.5 MG tablet   methocarbamol (ROBAXIN) 750 MG tablet  Lumbar facet syndrome (Location of Primary Source of Pain) (Bilateral) (L>R) (Chronic)   Relevant Medications   meloxicam (MOBIC) 7.5 MG tablet   methocarbamol (ROBAXIN) 750 MG tablet   Other Relevant Orders   LUMBAR FACET(MEDIAL BRANCH NERVE BLOCK) MBNB   LUMBAR FACET(MEDIAL BRANCH NERVE BLOCK) MBNB   Lumbar foraminal stenosis at L4-5 (Bilateral) (Chronic)   Relevant Orders   Lumbar Transforaminal Epidural   Lumbar spondylosis (Chronic)   Relevant Medications   meloxicam (MOBIC) 7.5 MG tablet   methocarbamol (ROBAXIN) 750 MG tablet   Musculoskeletal pain (Chronic)   Relevant Medications   methocarbamol (ROBAXIN) 750 MG tablet     Medium   Long term current use of opiate analgesic (Chronic)   Long term prescription benzodiazepine use (Chronic)   Marijuana use   Opiate use (22.5 MME/Day) (Chronic)    Other Visit Diagnoses   None.      Pharmacotherapy (Medications Ordered): Meds ordered this encounter  Medications  . meloxicam (MOBIC) 7.5 MG tablet    Sig: Take 1 tablet (7.5 mg total) by mouth 2 (two) times daily after a meal.    Dispense:  60 tablet    Refill:  2    Do not add this medication to the electronic "Automatic Refill" notification system. Patient may have prescription filled one day early if pharmacy is closed on scheduled refill date.  . methocarbamol (ROBAXIN) 750 MG tablet    Sig: Take 1 tablet (750 mg total) by mouth every 8 (eight) hours as needed for muscle spasms.    Dispense:  90 tablet    Refill:  2    Do not place this medication, or any other prescription from our practice, on  "Automatic Refill". Patient may have prescription filled one day early if pharmacy is closed on scheduled refill date.    Lab-work & Procedure Ordered: Orders Placed This Encounter  Procedures  . LUMBAR FACET(MEDIAL BRANCH NERVE BLOCK) MBNB  . Lumbar Transforaminal Epidural  . KNEE INJECTION  . LUMBAR FACET(MEDIAL BRANCH NERVE BLOCK) MBNB    Imaging Ordered: DG LUMBAR SPINE COMPLETE W/BEND 6+V DG SI JOINTS DG HIP UNILAT W OR W/O PELVIS 2-3 VIEWS LEFT  Interventional Therapies: Scheduled:  Diagnostic Bilateral Lumbar facet block under fluor and sedation.   Considering:   Diagnostic bilateral lumbar facet block under fluoroscopic guidance and IV sedation.  Possible bilateral lumbar facet radiofrequency ablation under fluoroscopic guidance and IV sedation the pending on the results of the diagnostic injection.  Diagnostic bilateral intra-articular knee joint injection, without fluoroscopic guidance or IV sedation.  Possible series of 5 Hyalgan intra-articular knee injections, without fluoroscopy or IV sedation.  Diagnostic bilateral Genicular nerve block under fluoroscopic guidance, with or without sedation.  Possible bilateral Genicular nerve radiofrequency ablation under fluoroscopic guidance and IV sedation, the pending on the results of the diagnostic injection.  Diagnostic bilateral L4-5 transforaminal epidural steroid injection under fluoroscopic guidance, with or without sedation.  Possible bilateral L4-5 radiofrequency ganglia neurotomy under fluoroscopic guidance and IV sedation.  Possible bilateral thoracolumbar spinal cord stimulator trial and implant for the lower extremity pain.    PRN Procedures:   Diagnostic bilateral lumbar facet block under fluoroscopic guidance and IV sedation.  Diagnostic bilateral intra-articular knee joint injection, without fluoroscopic guidance or IV sedation.  Diagnostic bilateral L4-5 transforaminal epidural steroid injection under fluoroscopic  guidance, with or without sedation.    Referral(s) or Consult(s): None at this time.  New Prescriptions   METHOCARBAMOL (ROBAXIN) 750 MG TABLET  Take 1 tablet (750 mg total) by mouth every 8 (eight) hours as needed for muscle spasms.    Medications administered during this visit: Mr. Cucinella had no medications administered during this visit.  Requested PM Follow-up: Return for Schedule Procedure, (ASAP).  Future Appointments Date Time Provider [place]  04/02/2016 12:45 PM Milinda Pointer, MD [facility] None    Primary Care Physician: Tracie Harrier, MD Location: [facility] Note by: Kathlen Brunswick. Dossie Arbour, M.D, DABA, DABAPM, DABPM, DABIPP, FIPP  Pain Score Disclaimer: We use the NRS-11 scale. This is a self-reported, subjective measurement of pain severity with only modest accuracy. It is used primarily to identify changes within a particular patient. It must be understood that outpatient pain scales are significantly less accurate that those used for research, where they can be applied under ideal controlled circumstances with minimal exposure to variables. In reality, the score is likely to be a combination of pain intensity and pain affect, where pain affect describes the degree of emotional arousal or changes in action readiness caused by the sensory experience of pain. Factors such as social and work situation, setting, emotional state, anxiety levels, expectation, and prior pain experience may influence pain perception and show large inter-individual differences that may also be affected by time variables.  Patient instructions provided during this appointment: Patient Instructions  Epidural Steroid Injection Patient Information  Description: The epidural space surrounds the nerves as they exit the spinal cord.  In some patients, the nerves can be compressed and inflamed by a bulging disc or a tight spinal canal (spinal stenosis).  By  injecting steroids into the epidural space, we can bring irritated nerves into direct contact with a potentially helpful medication.  These steroids act directly on the irritated nerves and can reduce swelling and inflammation which often leads to decreased pain.  Epidural steroids may be injected anywhere along the spine and from the neck to the low back depending upon the location of your pain.   After numbing the skin with local anesthetic (like Novocaine), a small needle is passed into the epidural space slowly.  You may experience a sensation of pressure while this is being done.  The entire block usually last less than 10 minutes.  Conditions which may be treated by epidural steroids:   Low back and leg pain  Neck and arm pain  Spinal stenosis  Post-laminectomy syndrome  Herpes zoster (shingles) pain  Pain from compression fractures  Preparation for the injection:  1. Do not eat any solid food or dairy products within 8 hours of your appointment.  2. You may drink clear liquids up to 3 hours before appointment.  Clear liquids include water, black coffee, juice or soda.  No milk or cream please. 3. You may take your regular medication, including pain medications, with a sip of water before your appointment  Diabetics should hold regular insulin (if taken separately) and take 1/2 normal NPH dos the morning of the procedure.  Carry some sugar containing items with you to your appointment. 4. A driver must accompany you and be prepared to drive you home after your procedure.  5. Bring all your current medications with your. 6. An IV may be inserted and sedation may be given at the discretion of the physician.   7. A blood pressure cuff, EKG and other monitors will often be applied during the procedure.  Some patients may need to have extra oxygen administered for a short period. 8. You will be asked to  provide medical information, including your allergies, prior to the procedure.  We  must know immediately if you are taking blood thinners (like Coumadin/Warfarin)  Or if you are allergic to IV iodine contrast (dye). We must know if you could possible be pregnant.  Possible side-effects:  Bleeding from needle site  Infection (rare, may require surgery)  Nerve injury (rare)  Numbness & tingling (temporary)  Difficulty urinating (rare, temporary)  Spinal headache ( a headache worse with upright posture)  Light -headedness (temporary)  Pain at injection site (several days)  Decreased blood pressure (temporary)  Weakness in arm/leg (temporary)  Pressure sensation in back/neck (temporary)  Call if you experience:  Fever/chills associated with headache or increased back/neck pain.  Headache worsened by an upright position.  New onset weakness or numbness of an extremity below the injection site  Hives or difficulty breathing (go to the emergency room)  Inflammation or drainage at the infection site  Severe back/neck pain  Any new symptoms which are concerning to you  Please note:  Although the local anesthetic injected can often make your back or neck feel good for several hours after the injection, the pain will likely return.  It takes 3-7 days for steroids to work in the epidural space.  You may not notice any pain relief for at least that one week.  If effective, we will often do a series of three injections spaced 3-6 weeks apart to maximally decrease your pain.  After the initial series, we generally will wait several months before considering a repeat injection of the same type.  If you have any questions, please call [phone] [facility] Patient Information  Description: The facets are joints in the spine between the vertebrae.  Like any joints in the body, facets can become irritated and painful.  Arthritis can also effect the facets.  By injecting steroids and local anesthetic in and around  these joints, we can temporarily block the nerve supply to them.  Steroids act directly on irritated nerves and tissues to reduce selling and inflammation which often leads to decreased pain.  Facet blocks may be done anywhere along the spine from the neck to the low back depending upon the location of your pain.   After numbing the skin with local anesthetic (like Novocaine), a small needle is passed onto the facet joints under x-ray guidance.  You may experience a sensation of pressure while this is being done.  The entire block usually lasts about 15-25 minutes.   Conditions which may be treated by facet blocks:   Low back/buttock pain  Neck/shoulder pain  Certain types of headaches  Preparation for the injection:  12. Do not eat any solid food or dairy products within 8 hours of your appointment. 13. You may drink clear liquid up to 3 hours before appointment.  Clear liquids include water, black coffee, juice or soda.  No milk or cream please. 14. You may take your regular medication, including pain medications, with a sip of water before your appointment.  Diabetics should hold regular insulin (if taken separately) and take 1/2 normal NPH dose the morning of the procedure.  Carry some sugar containing items with you to your appointment. 15. A driver must accompany you and be prepared to drive you home after your procedure. 60. Bring all your current medications with you. 17. An IV may be inserted and sedation may be given at the discretion of the physician. 18. A blood pressure  cuff, EKG and other monitors will often be applied during the procedure.  Some patients may need to have extra oxygen administered for a short period. 67. You will be asked to provide medical information, including your allergies and medications, prior to the procedure.  We must know immediately if you are taking blood thinners (like Coumadin/Warfarin) or if you are allergic to IV iodine contrast (dye).  We must know  if you could possible be pregnant.  Possible side-effects:   Bleeding from needle site  Infection (rare, may require surgery)  Nerve injury (rare)  Numbness & tingling (temporary)  Difficulty urinating (rare, temporary)  Spinal headache (a headache worse with upright posture)  Light-headedness (temporary)  Pain at injection site (serveral days)  Decreased blood pressure (rare, temporary)  Weakness in arm/leg (temporary)  Pressure sensation in back/neck (temporary)   Call if you experience:   Fever/chills associated with headache or increased back/neck pain  Headache worsened by an upright position  New onset, weakness or numbness of an extremity below the injection site  Hives or difficulty breathing (go to the emergency room)  Inflammation or drainage at the injection site(s)  Severe back/neck pain greater than usual  New symptoms which are concerning to you  Please note:  Although the local anesthetic injected can often make your back or neck feel good for several hours after the injection, the pain will likely return. It takes 3-7 days for steroids to work.  You may not notice any pain relief for at least one week.  If effective, we will often do a series of 2-3 injections spaced 3-6 weeks apart to maximally decrease your pain.  After the initial series, you may be a candidate for a more permanent nerve block of the facets.  If you have any questions, please call #[phone]) [facility]

## 2016-03-31 NOTE — Progress Notes (Signed)
Encounter canceled

## 2016-04-02 ENCOUNTER — Encounter: Payer: Self-pay | Admitting: Pain Medicine

## 2016-04-02 ENCOUNTER — Ambulatory Visit: Payer: BLUE CROSS/BLUE SHIELD | Admitting: Pain Medicine

## 2016-04-02 ENCOUNTER — Ambulatory Visit: Payer: BLUE CROSS/BLUE SHIELD | Attending: Pain Medicine | Admitting: Pain Medicine

## 2016-04-02 VITALS — BP 108/65 | HR 62 | Temp 97.1°F | Resp 14 | Ht 71.0 in | Wt 295.0 lb

## 2016-04-02 DIAGNOSIS — F411 Generalized anxiety disorder: Secondary | ICD-10-CM | POA: Insufficient documentation

## 2016-04-02 DIAGNOSIS — Z79891 Long term (current) use of opiate analgesic: Secondary | ICD-10-CM | POA: Insufficient documentation

## 2016-04-02 DIAGNOSIS — M2241 Chondromalacia patellae, right knee: Secondary | ICD-10-CM | POA: Insufficient documentation

## 2016-04-02 DIAGNOSIS — D352 Benign neoplasm of pituitary gland: Secondary | ICD-10-CM | POA: Insufficient documentation

## 2016-04-02 DIAGNOSIS — F329 Major depressive disorder, single episode, unspecified: Secondary | ICD-10-CM | POA: Diagnosis not present

## 2016-04-02 DIAGNOSIS — M79605 Pain in left leg: Secondary | ICD-10-CM | POA: Insufficient documentation

## 2016-04-02 DIAGNOSIS — M791 Myalgia: Secondary | ICD-10-CM | POA: Diagnosis not present

## 2016-04-02 DIAGNOSIS — M545 Low back pain, unspecified: Secondary | ICD-10-CM

## 2016-04-02 DIAGNOSIS — E7800 Pure hypercholesterolemia, unspecified: Secondary | ICD-10-CM | POA: Diagnosis not present

## 2016-04-02 DIAGNOSIS — M4806 Spinal stenosis, lumbar region: Secondary | ICD-10-CM | POA: Insufficient documentation

## 2016-04-02 DIAGNOSIS — M79604 Pain in right leg: Secondary | ICD-10-CM | POA: Diagnosis not present

## 2016-04-02 DIAGNOSIS — G4730 Sleep apnea, unspecified: Secondary | ICD-10-CM | POA: Diagnosis not present

## 2016-04-02 DIAGNOSIS — G8929 Other chronic pain: Secondary | ICD-10-CM | POA: Diagnosis not present

## 2016-04-02 DIAGNOSIS — I10 Essential (primary) hypertension: Secondary | ICD-10-CM | POA: Diagnosis not present

## 2016-04-02 DIAGNOSIS — M25562 Pain in left knee: Secondary | ICD-10-CM | POA: Insufficient documentation

## 2016-04-02 DIAGNOSIS — Z6841 Body Mass Index (BMI) 40.0 and over, adult: Secondary | ICD-10-CM | POA: Diagnosis not present

## 2016-04-02 DIAGNOSIS — M47816 Spondylosis without myelopathy or radiculopathy, lumbar region: Secondary | ICD-10-CM | POA: Insufficient documentation

## 2016-04-02 DIAGNOSIS — M533 Sacrococcygeal disorders, not elsewhere classified: Secondary | ICD-10-CM | POA: Insufficient documentation

## 2016-04-02 DIAGNOSIS — M25561 Pain in right knee: Secondary | ICD-10-CM | POA: Diagnosis not present

## 2016-04-02 DIAGNOSIS — K219 Gastro-esophageal reflux disease without esophagitis: Secondary | ICD-10-CM | POA: Diagnosis not present

## 2016-04-02 DIAGNOSIS — N200 Calculus of kidney: Secondary | ICD-10-CM | POA: Diagnosis not present

## 2016-04-02 DIAGNOSIS — F1290 Cannabis use, unspecified, uncomplicated: Secondary | ICD-10-CM | POA: Diagnosis not present

## 2016-04-02 MED ORDER — MIDAZOLAM HCL 5 MG/5ML IJ SOLN
1.0000 mg | INTRAMUSCULAR | Status: AC | PRN
  Filled 2016-04-02: qty 5

## 2016-04-02 MED ORDER — LACTATED RINGERS IV SOLN
1000.0000 mL | Freq: Once | INTRAVENOUS | Status: AC
  Administered 2016-04-02: 1000 mL via INTRAVENOUS

## 2016-04-02 MED ORDER — LIDOCAINE HCL (PF) 1 % IJ SOLN
10.0000 mL | Freq: Once | INTRAMUSCULAR | Status: AC
  Administered 2016-04-02: 10 mL
  Filled 2016-04-02: qty 10

## 2016-04-02 MED ORDER — TRIAMCINOLONE ACETONIDE 40 MG/ML IJ SUSP
40.0000 mg | Freq: Once | INTRAMUSCULAR | Status: AC
  Administered 2016-04-02: 40 mg
  Filled 2016-04-02: qty 2

## 2016-04-02 MED ORDER — ROPIVACAINE HCL 2 MG/ML IJ SOLN
9.0000 mL | Freq: Once | INTRAMUSCULAR | Status: AC
  Administered 2016-04-02: 9 mL
  Filled 2016-04-02: qty 20

## 2016-04-02 MED ORDER — FENTANYL CITRATE (PF) 100 MCG/2ML IJ SOLN
25.0000 ug | INTRAMUSCULAR | Status: AC | PRN
Start: 2016-04-02 — End: ?
  Filled 2016-04-02: qty 2

## 2016-04-02 NOTE — Patient Instructions (Signed)
Pain Management Discharge Instructions  General Discharge Instructions :  If you need to reach your doctor call: Monday-Friday 8:00 am - 4:00 pm at [phone] or toll free [phone].  After clinic hours [phone] to have operator reach doctor.  Bring all of your medication bottles to all your appointments in the pain clinic.  To cancel or reschedule your appointment with Pain Management please remember to call 24 hours in advance to avoid a fee.  Refer to the educational materials which you have been given on: General Risks, I had my Procedure. Discharge Instructions, Post Sedation.  Post Procedure Instructions:  The drugs you were given will stay in your system until tomorrow, so for the next 24 hours you should not drive, make any legal decisions or drink any alcoholic beverages.  You may eat anything you prefer, but it is better to start with liquids then soups and crackers, and gradually work up to solid foods.  Please notify your doctor immediately if you have any unusual bleeding, trouble breathing or pain that is not related to your normal pain.  Depending on the type of procedure that was done, some parts of your body may feel week and/or numb.  This usually clears up by tonight or the next day.  Walk with the use of an assistive device or accompanied by an adult for the 24 hours.  You may use ice on the affected area for the first 24 hours.  Put ice in a Ziploc bag and cover with a towel and place against area 15 minutes on 15 minutes off.  You may switch to heat after 24 hours.Facet Joint Block, Care After Refer to this sheet in the next few weeks. These instructions provide you with information on caring for yourself after your procedure. Your health care provider may also give you more specific instructions. Your treatment has been planned according to current medical practices, but problems sometimes occur. Call your health care provider if you have any problems or  questions after your procedure. HOME CARE INSTRUCTIONS   Keep track of the amount of pain relief you feel and how long it lasts.  Limit pain medicine within the first 4-6 hours after the procedure as directed by your health care provider.  Resume taking dietary supplements and medicines as directed by your health care provider.  You may resume your regular diet.  Do not apply heat near or over the injection site(s) for 24 hours.   Do not take a bath or soak in water (such as a pool or lake) for 24 hours.  Do not drive for 24 hours unless approved by your health care provider.  Avoid strenuous activity for 24 hours.  Remove your bandages the morning after the procedure.   If the injection site is tender, applying an ice pack may relieve some tenderness. To do this:  Put ice in a bag.  Place a towel between your skin and the bag.  Leave the ice on for 15-20 minutes, 3-4 times a day.  Keep follow-up appointments as directed by your health care provider. SEEK MEDICAL CARE IF:   Your pain is not controlled by your medicines.   There is drainage from the injection site.   There is significant bleeding or swelling at the injection site.  You have diabetes and your blood sugar is above 180 mg/dL. SEEK IMMEDIATE MEDICAL CARE IF:   You develop a fever of 101F (38.3C) or greater.   You have worsening pain or swelling around   the injection site.   You have red streaking around the injection site.   You develop severe pain that is not controlled by your medicines.   You develop a headache, stiff neck, nausea, or vomiting.   Your eyes become very sensitive to light.   You have weakness, paralysis, or tingling in your arms or legs that was not present before the procedure.   You develop difficulty urinating or breathing.    This information is not intended to replace advice given to you by your health care provider. Make sure you discuss any questions you have  with your health care provider.   Document Released: 07/06/2012 Document Revised: 08/10/2014 Document Reviewed: 07/06/2012 Elsevier Interactive Patient Education 2016 [employer] Facet Joint Block The facet joints connect the bones of the spine (vertebrae). They make it possible for you to bend, twist, and make other movements with your spine. They also prevent you from overbending, overtwisting, and making other excessive movements.  A facet joint block is a procedure where a numbing medicine (anesthetic) is injected into a facet joint. Often, a type of anti-inflammatory medicine called a steroid is also injected. A facet joint block may be done for two reasons:   Diagnosis. A facet joint block may be done as a test to see whether neck or back pain is caused by a worn-down or infected facet joint. If the pain gets better after a facet joint block, it means the pain is probably coming from the facet joint. If the pain does not get better, it means the pain is probably not coming from the facet joint.   Therapy. A facet joint block may be done to relieve neck or back pain caused by a facet joint. A facet joint block is only done as a therapy if the pain does not improve with medicine, exercise programs, physical therapy, and other forms of pain management. LET YOUR HEALTH CARE PROVIDER KNOW ABOUT:   Any allergies you have.   All medicines you are taking, including vitamins, herbs, eyedrops, and over-the-counter medicines and creams.   Previous problems you or members of your family have had with the use of anesthetics.   Any blood disorders you have had.   Other health problems you have. RISKS AND COMPLICATIONS Generally, having a facet joint block is safe. However, as with any procedure, complications can occur. Possible complications associated with having a facet joint block include:   Bleeding.   Injury to a nerve near the injection site.   Pain at the injection site.    Weakness or numbness in areas controlled by nerves near the injection site.   Infection.   Temporary fluid retention.   Allergic reaction to anesthetics or medicines used during the procedure. BEFORE THE PROCEDURE   Follow your health care provider's instructions if you are taking dietary supplements or medicines. You may need to stop taking them or reduce your dosage.   Do not take any new dietary supplements or medicines without asking your health care provider first.   Follow your health care provider's instructions about eating and drinking before the procedure. You may need to stop eating and drinking several hours before the procedure.   Arrange to have an adult drive you home after the procedure. PROCEDURE  You may need to remove your clothing and dress in an open-back gown so that your health care provider can access your spine.   The procedure will be done while you are lying on an X-ray table. Most   of the time you will be asked to lie on your stomach, but you may be asked to lie in a different position if an injection will be made in your neck.   Special machines will be used to monitor your oxygen levels, heart rate, and blood pressure.   If an injection will be made in your neck, an intravenous (IV) tube will be inserted into one of your veins. Fluids and medicine will flow directly into your body through the IV tube.   The area over the facet joint where the injection will be made will be cleaned with an antiseptic soap. The surrounding skin will be covered with sterile drapes.   An anesthetic will be applied to your skin to make the injection area numb. You may feel a temporary stinging or burning sensation.   A video X-ray machine will be used to locate the joint. A contrast dye may be injected into the facet joint area to help with locating the joint.   When the joint is located, an anesthetic medicine will be injected into the joint through the  needle.   Your health care provider will ask you whether you feel pain relief. If you do feel relief, a steroid may be injected to provide pain relief for a longer period of time. If you do not feel relief or feel only partial relief, additional injections of an anesthetic may be made in other facet joints.   The needle will be removed, the skin will be cleansed, and bandages will be applied.  AFTER THE PROCEDURE   You will be observed for 15-30 minutes before being allowed to go home. Do not drive. Have an adult drive you or take a taxi or public transportation instead.   If you feel pain relief, the pain will return in several hours or days when the anesthetic wears off.   You may feel pain relief 2-14 days after the procedure. The amount of time this relief lasts varies from person to person.   It is normal to feel some tenderness over the injected area(s) for 2 days following the procedure.   If you have diabetes, you may have a temporary increase in blood sugar.   This information is not intended to replace advice given to you by your health care provider. Make sure you discuss any questions you have with your health care provider.   Document Released: 12/09/2006 Document Revised: 08/10/2014 Document Reviewed: 05/09/2012 Elsevier Interactive Patient Education 2016 [employer] GENERAL RISKS AND COMPLICATIONS  What are the risk, side effects and possible complications? Generally speaking, most procedures are safe.  However, with any procedure there are risks, side effects, and the possibility of complications.  The risks and complications are dependent upon the sites that are lesioned, or the type of nerve block to be performed.  The closer the procedure is to the spine, the more serious the risks are.  Great care is taken when placing the radio frequency needles, block needles or lesioning probes, but sometimes complications can occur. 1. Infection: Any time there is an  injection through the skin, there is a risk of infection.  This is why sterile conditions are used for these blocks.  There are four possible types of infection. 1. Localized skin infection. 2. Central Nervous System Infection-This can be in the form of Meningitis, which can be deadly. 3. Epidural Infections-This can be in the form of an epidural abscess, which can cause pressure inside of the spine, causing compression of   the spinal cord with subsequent paralysis. This would require an emergency surgery to decompress, and there are no guarantees that the patient would recover from the paralysis. 4. Discitis-This is an infection of the intervertebral discs.  It occurs in about 1% of discography procedures.  It is difficult to treat and it may lead to surgery.        2. Pain: the needles have to go through skin and soft tissues, will cause soreness.       3. Damage to internal structures:  The nerves to be lesioned may be near blood vessels or    other nerves which can be potentially damaged.       4. Bleeding: Bleeding is more common if the patient is taking blood thinners such as  aspirin, Coumadin, Ticiid, Plavix, etc., or if he/she have some genetic predisposition  such as hemophilia. Bleeding into the spinal canal can cause compression of the spinal  cord with subsequent paralysis.  This would require an emergency surgery to  decompress and there are no guarantees that the patient would recover from the  paralysis.       5. Pneumothorax:  Puncturing of a lung is a possibility, every time a needle is introduced in  the area of the chest or upper back.  Pneumothorax refers to free air around the  collapsed lung(s), inside of the thoracic cavity (chest cavity).  Another two possible  complications related to a similar event would include: Hemothorax and Chylothorax.   These are variations of the Pneumothorax, where instead of air around the collapsed  lung(s), you may have blood or chyle, respectively.        6. Spinal headaches: They may occur with any procedures in the area of the spine.       7. Persistent CSF (Cerebro-Spinal Fluid) leakage: This is a rare problem, but may occur  with prolonged intrathecal or epidural catheters either due to the formation of a fistulous  track or a dural tear.       8. Nerve damage: By working so close to the spinal cord, there is always a possibility of  nerve damage, which could be as serious as a permanent spinal cord injury with  paralysis.       9. Death:  Although rare, severe deadly allergic reactions known as "Anaphylactic  reaction" can occur to any of the medications used.      10. Worsening of the symptoms:  We can always make thing worse.  What are the chances of something like this happening? Chances of any of this occuring are extremely low.  By statistics, you have more of a chance of getting killed in a motor vehicle accident: while driving to the hospital than any of the above occurring .  Nevertheless, you should be aware that they are possibilities.  In general, it is similar to taking a shower.  Everybody knows that you can slip, hit your head and get killed.  Does that mean that you should not shower again?  Nevertheless always keep in mind that statistics do not mean anything if you happen to be on the wrong side of them.  Even if a procedure has a 1 (one) in a 1,000,000 (million) chance of going wrong, it you happen to be that one..Also, keep in mind that by statistics, you have more of a chance of having something go wrong when taking medications.  Who should not have this procedure? If you are on a blood thinning   medication (e.g. Coumadin, Plavix, see list of "Blood Thinners"), or if you have an active infection going on, you should not have the procedure.  If you are taking any blood thinners, please inform your physician.  How should I prepare for this procedure?  Do not eat or drink anything at least six hours prior to the  procedure.  Bring a driver with you .  It cannot be a taxi.  Come accompanied by an adult that can drive you back, and that is strong enough to help you if your legs get weak or numb from the local anesthetic.  Take all of your medicines the morning of the procedure with just enough water to swallow them.  If you have diabetes, make sure that you are scheduled to have your procedure done first thing in the morning, whenever possible.  If you have diabetes, take only half of your insulin dose and notify our nurse that you have done so as soon as you arrive at the clinic.  If you are diabetic, but only take blood sugar pills (oral hypoglycemic), then do not take them on the morning of your procedure.  You may take them after you have had the procedure.  Do not take aspirin or any aspirin-containing medications, at least eleven (11) days prior to the procedure.  They may prolong bleeding.  Wear loose fitting clothing that may be easy to take off and that you would not mind if it got stained with Betadine or blood.  Do not wear any jewelry or perfume  Remove any nail coloring.  It will interfere with some of our monitoring equipment.  NOTE: Remember that this is not meant to be interpreted as a complete list of all possible complications.  Unforeseen problems may occur.  BLOOD THINNERS The following drugs contain aspirin or other products, which can cause increased bleeding during surgery and should not be taken for 2 weeks prior to and 1 week after surgery.  If you should need take something for relief of minor pain, you may take acetaminophen which is found in Tylenol,m Datril, Anacin-3 and Panadol. It is not blood thinner. The products listed below are.  Do not take any of the products listed below in addition to any listed on your instruction sheet.  A.P.C or A.P.C with Codeine Codeine Phosphate Capsules #3 Ibuprofen Ridaura  ABC compound Congesprin Imuran rimadil  Advil Cope Indocin  Robaxisal  Alka-Seltzer Effervescent Pain Reliever and Antacid Coricidin or Coricidin-D  Indomethacin Rufen  Alka-Seltzer plus Cold Medicine Cosprin Ketoprofen S-A-C Tablets  Anacin Analgesic Tablets or Capsules Coumadin Korlgesic Salflex  Anacin Extra Strength Analgesic tablets or capsules CP-2 Tablets Lanoril Salicylate  Anaprox Cuprimine Capsules Levenox Salocol  Anexsia-D Dalteparin Magan Salsalate  Anodynos Darvon compound Magnesium Salicylate Sine-off  Ansaid Dasin Capsules Magsal Sodium Salicylate  Anturane Depen Capsules Marnal Soma  APF Arthritis pain formula Dewitt's Pills Measurin Stanback  Argesic Dia-Gesic Meclofenamic Sulfinpyrazone  Arthritis Bayer Timed Release Aspirin Diclofenac Meclomen Sulindac  Arthritis pain formula Anacin Dicumarol Medipren Supac  Analgesic (Safety coated) Arthralgen Diffunasal Mefanamic Suprofen  Arthritis Strength Bufferin Dihydrocodeine Mepro Compound Suprol  Arthropan liquid Dopirydamole Methcarbomol with Aspirin Synalgos  ASA tablets/Enseals Disalcid Micrainin Tagament  Ascriptin Doan's Midol Talwin  Ascriptin A/D Dolene Mobidin Tanderil  Ascriptin Extra Strength Dolobid Moblgesic Ticlid  Ascriptin with Codeine Doloprin or Doloprin with Codeine Momentum Tolectin  Asperbuf Duoprin Mono-gesic Trendar  Aspergum Duradyne Motrin or Motrin IB Triminicin  Aspirin plain, buffered or enteric coated Durasal   Myochrisine Trigesic  Aspirin Suppositories Easprin Nalfon Trillsate  Aspirin with Codeine Ecotrin Regular or Extra Strength Naprosyn Uracel  Atromid-S Efficin Naproxen Ursinus  Auranofin Capsules Elmiron Neocylate Vanquish  Axotal Emagrin Norgesic Verin  Azathioprine Empirin or Empirin with Codeine Normiflo Vitamin E  Azolid Emprazil Nuprin Voltaren  Bayer Aspirin plain, buffered or children's or timed BC Tablets or powders Encaprin Orgaran Warfarin Sodium  Buff-a-Comp Enoxaparin Orudis Zorpin  Buff-a-Comp with Codeine Equegesic Os-Cal-Gesic    Buffaprin Excedrin plain, buffered or Extra Strength Oxalid   Bufferin Arthritis Strength Feldene Oxphenbutazone   Bufferin plain or Extra Strength Feldene Capsules Oxycodone with Aspirin   Bufferin with Codeine Fenoprofen Fenoprofen Pabalate or Pabalate-SF   Buffets II Flogesic Panagesic   Buffinol plain or Extra Strength Florinal or Florinal with Codeine Panwarfarin   Buf-Tabs Flurbiprofen Penicillamine   Butalbital Compound Four-way cold tablets Penicillin   Butazolidin Fragmin Pepto-Bismol   Carbenicillin Geminisyn Percodan   Carna Arthritis Reliever Geopen Persantine   Carprofen Gold's salt Persistin   Chloramphenicol Goody's Phenylbutazone   Chloromycetin Haltrain Piroxlcam   Clmetidine heparin Plaquenil   Cllnoril Hyco-pap Ponstel   Clofibrate Hydroxy chloroquine Propoxyphen         Before stopping any of these medications, be sure to consult the physician who ordered them.  Some, such as Coumadin (Warfarin) are ordered to prevent or treat serious conditions such as "deep thrombosis", "pumonary embolisms", and other heart problems.  The amount of time that you may need off of the medication may also vary with the medication and the reason for which you were taking it.  If you are taking any of these medications, please make sure you notify your pain physician before you undergo any procedures.          

## 2016-04-02 NOTE — Progress Notes (Signed)
Safety precautions to be maintained throughout the outpatient stay will include: orient to surroundings, keep bed in low position, maintain call bell within reach at all times, provide assistance with transfer out of bed and ambulation.  

## 2016-04-02 NOTE — Progress Notes (Signed)
Patient's Name: Gerald Harris  Patient type: Established  MRN: [id number]  Service setting: Ambulatory outpatient  DOB: Feb 21, 1971  Location: [facility]  DOS: 04/02/2016  Primary Care Physician: Tracie Harrier, MD  Note by: Kathlen Brunswick. Dossie Arbour, MD  Referring Physician: Tracie Harrier, MD  Specialty: Interventional Pain Management  Last Visit to Pain Management: 03/18/2016   Primary Reason(s) for Visit: Interventional Pain Management Treatment. CC: No chief complaint on file.   Procedure:  Anesthesia, Analgesia, Anxiolysis:  Type: Diagnostic Medial Branch Facet Block Region: Lumbar Level: L2, L3, L4, L5, & S1 Medial Branch Level(s) Laterality: Bilateral    Type: Moderate (Conscious) Sedation & Local Anesthesia Local Anesthetic: Lidocaine 1% Route: Intravenous (IV) IV Access: Secured Sedation: Meaningful verbal contact was maintained at all times during the procedure  Indication(s): Analgesia & Anxiolysis   Indications: 1. Lumbar facet syndrome (Location of Primary Source of Pain) (Bilateral) (L>R)   2. Chronic low back pain (Location of Primary Source of Pain) (Bilateral) (L>R)   3. Lumbar facet arthropathy (multilevel)   4. Lumbar spondylosis, unspecified spinal osteoarthritis     Pre-procedure Pain Score: 8/10 Post-procedure Pain Score: 4 /10  Pre-Procedure Assessment:  Gerald Harris is a 45 y.o. year old, male patient, seen today for interventional treatment. He has Body mass index (BMI) of 40.0-44.9 in adult [facility]); Clinical depression; Anxiety, generalized; Acid reflux; Hypercholesterolemia; BP (high blood pressure); Calculus of kidney; Apnea, sleep; Chronic pain; Chronic low back pain (Location of Primary Source of Pain) (Bilateral) (L>R); Chronic lower extremity pain (Location of Secondary source of pain) (Bilateral) (R>L); Lumbar spondylosis; Chronic knee pain (Location of Tertiary source of pain) (Bilateral) (R>L); Long term current use of  opiate analgesic; Long term prescription opiate use; Opiate use (22.5 MME/Day); Encounter for therapeutic drug level monitoring; Encounter for pain management planning; Musculoskeletal pain; Chronic hip pain (Left); Chronic sacroiliac joint pain (Right); Discogenic low back pain; Lumbar facet syndrome (Location of Primary Source of Pain) (Bilateral) (L>R); Pituitary microadenoma ([place]); Chondromalacia patellae of knee (Right); 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 His primarily concern today is the No chief complaint on file.   Pain Type: Chronic pain Pain Location: Back Pain Orientation: Lower Pain Descriptors / Indicators: Aching, Constant, Shooting, Radiating, Sharp Pain Frequency: Constant  Date of Last Visit: 03/18/16 Service Provided on Last Visit: Evaluation  Coagulation Parameters Lab Results  Component Value Date   PLT 227 08/30/2015    Verification of the correct person, correct site (including marking of site), and correct procedure were performed and confirmed by the patient.  Consent: Secured. Under the influence of no sedatives a written informed consent was obtained, after having provided information on the risks and possible complications. To fulfill our ethical and legal obligations, as recommended by the American Medical Association's Code of Ethics, we have provided information to the patient about our clinical impression; the nature and purpose of the treatment or procedure; the risks, benefits, and possible complications of the intervention; alternatives; the risk(s) and benefit(s) of the alternative treatment(s) or procedure(s); and the risk(s) and benefit(s) of doing nothing. The patient was provided information about the risks and possible complications associated with the procedure. These  include, but are not limited to, failure to achieve desired goals, infection, bleeding, organ or nerve damage, allergic reactions, paralysis, and death. In the case of spinal procedures these may include, but  are not limited to, failure to achieve desired goals, infection, bleeding, organ or nerve damage, allergic reactions, paralysis, and death. In addition, the patient was informed that Medicine is not an exact science; therefore, there is also the possibility of unforeseen risks and possible complications that may result in a catastrophic outcome. The patient indicated having understood very clearly. We have given the patient no guarantees and we have made no promises. Enough time was given to the patient to ask questions, all of which were answered to the patient's satisfaction.  Consent Attestation: I, the ordering provider, attest that I have discussed with the patient the benefits, risks, side-effects, alternatives, likelihood of achieving goals, and potential problems during recovery for the procedure that I have provided informed consent.  Pre-Procedure Preparation: Safety Precautions: Allergies reviewed. Appropriate site, procedure, and patient were confirmed by following the Joint Commission's Universal Protocol (UP.01.01.01), in the form of a "Time Out". The patient was asked to confirm marked site and procedure, before commencing. The patient was asked about blood thinners, or active infections, both of which were denied. Patient was assessed for positional comfort and all pressure points were checked before starting procedure. Infection Control Precautions: Sterile technique used. Standard Universal Precautions were taken as recommended by the Department of [facility] for Disease Control and Prevention (CDC). Standard pre-surgical skin prep was conducted. Respiratory hygiene and cough etiquette was practiced. Hand hygiene observed. Safe injection practices and needle disposal  techniques followed. SDV (single dose vial) medications used. Medications properly checked for expiration dates and contaminants. Personal protective equipment (PPE) used: Surgical mask. Sterile Radiation-resistant gloves. Monitoring:  As per clinic protocol. Vitals:   04/02/16 1430 04/02/16 1440 04/02/16 1450 04/02/16 1500  BP: 104/60 118/68 118/60 108/65  Pulse: 68 66 65 62  Resp: _0 Temp:  97.4 F (36.3 C)  97.1 F (36.2 C)  TempSrc:      SpO2: 97% 98% 99% 100%  Weight:      Height:      Calculated BMI: Body mass index is 41.14 kg/m. Allergies: He is allergic to ciprofloxacin.. Allergy Precautions: None required  Description of Procedure Process:   Time-out: "Time-out" completed before starting procedure, as per protocol. Position: Prone Target Area: For Lumbar Facet blocks, the target is the groove formed by the junction of the transverse process and superior articular process. For the L5 dorsal ramus, the target is the notch between superior articular process and sacral ala. For the S1 dorsal ramus, the target is the superior and lateral edge of the posterior S1 Sacral foramen. Approach: Paramedial approach. Area Prepped: Entire Posterior Lumbosacral Region Prepping solution: ChloraPrep (2% chlorhexidine gluconate and 70% isopropyl alcohol) Safety Precautions: Aspiration looking for blood return was conducted prior to all injections. At no point did we inject any substances, as a needle was being advanced. No attempts were made at seeking any paresthesias. Safe injection practices and needle disposal techniques used. Medications properly checked for expiration dates. SDV (single dose vial) medications used.   Description of the Procedure: Protocol guidelines were followed. The patient was placed in position over the fluoroscopy table. The target area was identified and the area prepped in the usual manner. Skin desensitized using vapocoolant spray. Skin & deeper tissues  infiltrated with local anesthetic. Appropriate amount of time allowed to pass for local anesthetics to take effect. The procedure needle was introduced through the skin, ipsilateral to the reported pain, and advanced to the target area. Employing the "Medial Branch Technique",  the needles were advanced to the angle made by the superior and medial portion of the transverse process, and the lateral and inferior portion of the superior articulating process of the targeted vertebral bodies. This area is known as "Burton's Eye" or the "Eye of the [place] Dog". A procedure needle was introduced through the skin, and this time advanced to the angle made by the superior and medial border of the sacral ala, and the lateral border of the S1 vertebral body. This last needle was later repositioned at the superior and lateral border of the posterior S1 foramen. Negative aspiration confirmed. Solution injected in intermittent fashion, asking for systemic symptoms every 0.5cc of injectate. The needles were then removed and the area cleansed, making sure to leave some of the prepping solution back to take advantage of its long term bactericidal properties. EBL: None Materials & Medications Used:  Needle(s) Used: 22g - 5" Spinal Needle(s)  Imaging Guidance:   Type of Imaging Technique: Fluoroscopy Guidance (Spinal) Indication(s): Assistance in needle guidance and placement for procedures requiring needle placement in or near specific anatomical locations not easily accessible without such assistance. Exposure Time: Please see nurses notes. Contrast: None required. Fluoroscopic Guidance: I was personally present in the fluoroscopy suite, where the patient was placed in position for the procedure, over the fluoroscopy-compatible table. Fluoroscopy was manipulated, using "Tunnel Vision Technique", to obtain the best possible view of the target area, on the affected side. Parallax error was corrected before commencing the  procedure. A "direction-depth-direction" technique was used to introduce the needle under continuous pulsed fluoroscopic guidance. Once the target was reached, antero-posterior, oblique, and lateral fluoroscopic projection views were taken to confirm needle placement in all planes. Permanently recorded images stored by scanning into EMR. Interpretation: Intraoperative imaging interpretation by performing Physician. Adequate needle placement confirmed. Adequate needle placement confirmed in AP, lateral, & Oblique Views. No contrast injected.  Antibiotic Prophylaxis:  Indication(s): No indications identified. Type:  Antibiotics Given (last 72 hours)    None       Post-operative Assessment:   Complications: No immediate post-treatment complications were observed. Disposition: Return to clinic for follow-up evaluation. The patient tolerated the entire procedure well. A repeat set of vitals were taken after the procedure and the patient was kept under observation following institutional policy, for this procedure. Post-procedural neurological assessment was performed, showing return to baseline, prior to discharge. The patient was discharged home, once institutional criteria were met. The patient was provided with post-procedure discharge instructions, including a section on how to identify potential problems. Should any problems arise concerning this procedure, the patient was given instructions to immediately contact us, at any time, without hesitation. In any case, we plan to contact the patient by telephone for a follow-up status report regarding this interventional procedure. Comments:  No additional relevant information.  Plan of Care   Problem List Items Addressed This Visit      High   Chronic low back pain (Location of Primary Source of Pain) (Bilateral) (L>R) (Chronic)   Relevant Medications   fentaNYL (SUBLIMAZE) injection 25-50 mcg   triamcinolone acetonide (KENALOG-40) injection 40 mg  (Completed)   Lumbar facet arthropathy (multilevel) (Chronic)   Lumbar facet syndrome (Location of Primary Source of Pain) (Bilateral) (L>R) - Primary (Chronic)   Relevant Medications   fentaNYL (SUBLIMAZE) injection 25-50 mcg   lactated ringers infusion 1,000 mL (Completed)   midazolam (VERSED) 5 MG/5ML injection 1-2 mg   triamcinolone acetonide (KENALOG-40) injection 40 mg (Completed)   lidocaine (  PF) (XYLOCAINE) 1 % injection 10 mL (Completed)   ropivacaine (PF) 2 mg/ml (0.2%) (NAROPIN) epidural 9 mL (Completed)   Other Relevant Orders   LUMBAR FACET(MEDIAL BRANCH NERVE BLOCK) MBNB   Lumbar spondylosis (Chronic)   Relevant Medications   fentaNYL (SUBLIMAZE) injection 25-50 mcg   triamcinolone acetonide (KENALOG-40) injection 40 mg (Completed)    Other Visit Diagnoses   None.     Requested PM Follow-up: Return in about 2 weeks (around 04/16/2016) for Post-Procedure evaluation.  Future Appointments Date Time Provider [place]  04/30/2016 9:40 AM Milinda Pointer, MD [facility] None    Primary Care Physician: Tracie Harrier, MD Location: [facility] Note by: Kathlen Brunswick. Dossie Arbour, M.D, DABA, DABAPM, DABPM, DABIPP, FIPP   Illustration of the posterior view of the lumbar spine and the posterior neural structures. Laminae of L2 through S1 are labeled. DPRL5, dorsal primary ramus of L5; DPRS1, dorsal primary ramus of S1; DPR3, dorsal primary ramus of L3; FJ, facet (zygapophyseal) joint L3-L4; I, inferior articular process of L4; LB1, lateral branch of dorsal primary ramus of L1; IAB, inferior articular branches from L3 medial branch (supplies L4-L5 facet joint); IBP, intermediate branch plexus; MB3, medial branch of dorsal primary ramus of L3; NR3, third lumbar nerve root; S, superior articular process of L5; SAB, superior articular branches from L4 (supplies L4-5 facet joint also); TP3, transverse process of L3.  Disclaimer:  Medicine is not an  Chief Strategy Officer. The only guarantee in medicine is that nothing is guaranteed. It is important to note that the decision to proceed with this intervention was based on the information collected from the patient. The Data and conclusions were drawn from the patient's questionnaire, the interview, and the physical examination. Because the information was provided in large part by the patient, it cannot be guaranteed that it has not been purposely or unconsciously manipulated. Every effort has been made to obtain as much relevant data as possible for this evaluation. It is important to note that the conclusions that lead to this procedure are derived in large part from the available data. Always take into account that the treatment will also be dependent on availability of resources and existing treatment guidelines, considered by other Pain Management Practitioners as being common knowledge and practice, at the time of the intervention. For Medico-Legal purposes, it is also important to point out that variation in procedural techniques and pharmacological choices are the acceptable norm. The indications, contraindications, technique, and results of the above procedure should only be interpreted and judged by a Board-Certified Interventional Pain Specialist with extensive familiarity and expertise in the same exact procedure and technique. Attempts at providing opinions without similar or greater experience and expertise than that of the treating physician will be considered as inappropriate and unethical, and shall result in a formal complaint to the state medical board and applicable specialty societies.

## 2016-04-03 ENCOUNTER — Emergency Department
Admission: EM | Admit: 2016-04-03 | Discharge: 2016-04-03 | Disposition: A | Discharge status: Home / Self Care | Payer: BLUE CROSS/BLUE SHIELD | Attending: Emergency Medicine | Admitting: Emergency Medicine

## 2016-04-03 ENCOUNTER — Encounter: Payer: Self-pay | Admitting: Emergency Medicine

## 2016-04-03 DIAGNOSIS — I10 Essential (primary) hypertension: Secondary | ICD-10-CM | POA: Insufficient documentation

## 2016-04-03 DIAGNOSIS — Z87891 Personal history of nicotine dependence: Secondary | ICD-10-CM | POA: Insufficient documentation

## 2016-04-03 DIAGNOSIS — Z79899 Other long term (current) drug therapy: Secondary | ICD-10-CM | POA: Insufficient documentation

## 2016-04-03 DIAGNOSIS — M5441 Lumbago with sciatica, right side: Secondary | ICD-10-CM

## 2016-04-03 DIAGNOSIS — M545 Low back pain: Secondary | ICD-10-CM | POA: Diagnosis present

## 2016-04-03 LAB — COMPREHENSIVE METABOLIC PANEL
ALK PHOS: 35 U/L — AB (ref 38–126)
ALT: 27 U/L (ref 17–63)
AST: 17 U/L (ref 15–41)
Albumin: 3.5 g/dL (ref 3.5–5.0)
Anion gap: 6 (ref 5–15)
BILIRUBIN TOTAL: 0.2 mg/dL — AB (ref 0.3–1.2)
BUN: 25 mg/dL — AB (ref 6–20)
CALCIUM: 9 mg/dL (ref 8.9–10.3)
CHLORIDE: 110 mmol/L (ref 101–111)
CO2: 23 mmol/L (ref 22–32)
CREATININE: 0.6 mg/dL — AB (ref 0.61–1.24)
Glucose, Bld: 104 mg/dL — ABNORMAL HIGH (ref 65–99)
Potassium: 4 mmol/L (ref 3.5–5.1)
Sodium: 139 mmol/L (ref 135–145)
TOTAL PROTEIN: 6.8 g/dL (ref 6.5–8.1)

## 2016-04-03 LAB — CBC WITH DIFFERENTIAL/PLATELET
BASOS ABS: 0.1 10*3/uL (ref 0–0.1)
Basophils Relative: 1 %
Eosinophils Absolute: 0 10*3/uL (ref 0–0.7)
Eosinophils Relative: 0 %
HEMATOCRIT: 42.6 % (ref 40.0–52.0)
HEMOGLOBIN: 14.7 g/dL (ref 13.0–18.0)
LYMPHS ABS: 2.3 10*3/uL (ref 1.0–3.6)
LYMPHS PCT: 11 %
MCH: 29.3 pg (ref 26.0–34.0)
MCHC: 34.5 g/dL (ref 32.0–36.0)
MCV: 85 fL (ref 80.0–100.0)
Monocytes Absolute: 1.5 10*3/uL — ABNORMAL HIGH (ref 0.2–1.0)
Monocytes Relative: 8 %
NEUTROS ABS: 16.1 10*3/uL — AB (ref 1.4–6.5)
Neutrophils Relative %: 80 %
Platelets: 240 10*3/uL (ref 150–440)
RBC: 5.01 MIL/uL (ref 4.40–5.90)
RDW: 13.8 % (ref 11.5–14.5)
WBC: 20.1 10*3/uL — AB (ref 3.8–10.6)

## 2016-04-03 MED ORDER — HYDROMORPHONE HCL 1 MG/ML IJ SOLN
1.0000 mg | Freq: Once | INTRAMUSCULAR | Status: AC
  Administered 2016-04-03: 1 mg via INTRAVENOUS
  Filled 2016-04-03: qty 1

## 2016-04-03 MED ORDER — ONDANSETRON HCL 4 MG/2ML IJ SOLN
4.0000 mg | Freq: Once | INTRAMUSCULAR | Status: AC
Start: 2016-04-03 — End: 2016-04-03
  Administered 2016-04-03: 4 mg via INTRAVENOUS
  Filled 2016-04-03: qty 2

## 2016-04-03 MED ORDER — OXYCODONE-ACETAMINOPHEN 5-325 MG PO TABS: 1.0000 | ORAL_TABLET | Freq: Four times a day (QID) | ORAL | 0 refills | Status: AC | PRN

## 2016-04-03 MED ORDER — SODIUM CHLORIDE 0.9 % IV BOLUS (SEPSIS)
1000.0000 mL | Freq: Once | INTRAVENOUS | Status: AC
  Administered 2016-04-03: 1000 mL via INTRAVENOUS

## 2016-04-03 NOTE — ED Triage Notes (Signed)
Pt states had injections to low back yesterday for back pain at pain clinic. Pt states today had had a headache, low grade temperature, flushing, and "extreme, ten fold" back pain. Pt states he feels like he is so weak he cannot walk, but is able to stand. Pt with pwd skin, denies vomiting.

## 2016-04-03 NOTE — ED Provider Notes (Signed)
[facility] Emergency Department Provider Note  Time seen: 9:10 PM  I have reviewed the triage vital signs and the nursing notes.   HISTORY  Chief Complaint Headache and Back Pain    HPI Gerald Harris is a 45 y.o. male with a past medical history of hypertension, chronic back pain, who presents the emergency department with lower back pain with right leg radiation. According to the patient he has a history of chronic back pain, he has had 5 steroid injections over the course of the past few months for his back. His most recent one was 2 days ago. He states yesterday after the injection he was feeling normal, however since last night he has had progressive worsening of pain in his lower back in the same location of his chronic pain along with right leg radiation which he states is his normal radicular pain as well.Patient states the pain has become severe tonight he is having a headache. Describes the pain as 9/10 sharp located in the right lower back and going down the right leg. Denies any urinary incontinence.  Past Medical History:  Diagnosis Date  . Back pain at L4-L5 level 09/12/2015  . DDD (degenerative disc disease), lumbar 09/12/2015  . Hypertension   . Kidney stones   . Nerve root pain 01/31/2016  . Radiculopathy of lumbosacral region 09/12/2015  . Status post spinal surgery 09/12/2015    Patient Active Problem List   Diagnosis Date Noted  . Long term prescription benzodiazepine use 03/17/2016  . Marijuana use 03/17/2016  . Clinical depression 02/21/2016  . Acid reflux 02/21/2016  . Hypercholesterolemia 02/21/2016  . BP (high blood pressure) 02/21/2016  . Calculus of kidney 02/21/2016  . Apnea, sleep 02/21/2016  . Chronic pain 02/21/2016  . Chronic low back pain (Location of Primary Source of Pain) (Bilateral) (L>R) 02/21/2016  . Chronic lower extremity pain (Location of Secondary source of pain) (Bilateral) (R>L) 02/21/2016  . Lumbar spondylosis  02/21/2016  . Chronic knee pain (Location of Tertiary source of pain) (Bilateral) (R>L) 02/21/2016  . Long term current use of opiate analgesic 02/21/2016  . Long term prescription opiate use 02/21/2016  . Opiate use (22.5 MME/Day) 02/21/2016  . Encounter for therapeutic drug level monitoring 02/21/2016  . Encounter for pain management planning 02/21/2016  . Musculoskeletal pain 02/21/2016  . Chronic hip pain (Left) 02/21/2016  . Chronic sacroiliac joint pain (Right) 02/21/2016  . Discogenic low back pain 02/21/2016  . Lumbar facet syndrome (Location of Primary Source of Pain) (Bilateral) (L>R) 02/21/2016  . Pituitary microadenoma ([place]) 02/21/2016    Class: History of  . Chondromalacia patellae of knee (Right) 02/21/2016  . 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 Lumbar foraminal stenosis at L4-5 (Bilateral) 02/21/2016  . Lumbar facet arthropathy (multilevel) 02/21/2016  . Anxiety, generalized 11/14/2015  . Body mass index (BMI) of 40.0-44.9 in adult [facility]) 06/04/2015    Past Surgical History:  Procedure Laterality Date  . ABDOMINAL EXPLORATION SURGERY    . CHOLECYSTECTOMY    . KNEE SURGERY Right     Prior to Admission medications   Medication Sig Start Date End Date Taking? Authorizing Provider  acetaminophen (TYLENOL) 325 MG tablet Take by mouth as needed.     Historical Provider, MD  clonazePAM (KLONOPIN) 1 MG tablet Take 1 mg by mouth 2 (two) times daily as needed for anxiety.    Historical Provider, MD  ibuprofen (ADVIL,MOTRIN) 200 MG tablet Take 400 mg by mouth as  needed. 2 tabs every 4 hours     Historical Provider, MD  lisinopril-hydrochlorothiazide (PRINZIDE,ZESTORETIC) 20-12.5 MG tablet 1 tab po twice a day 12/11/15   Historical Provider, MD  meloxicam (MOBIC) 7.5 MG tablet Take 1 tablet (7.5 mg total) by mouth 2 (two) times daily after a meal. 03/18/16   Milinda Pointer, MD  PARoxetine HCl (PAXIL PO) Take 60 mg by mouth daily.     Historical  Provider, MD    Allergies  Allergen Reactions  . Ciprofloxacin Anaphylaxis    Family History  Problem Relation Age of Onset  . Atrial fibrillation Mother   . Cancer Father     Social History Social History  Substance Use Topics  . Smoking status: Former Research scientist (life sciences)  . Smokeless tobacco: Never Used  . Alcohol use No    Review of Systems Constitutional: Positive for chills at home. Negative for fever. Cardiovascular: Negative for chest pain. Respiratory: Negative for shortness of breath. Gastrointestinal: Negative for abdominal pain Musculoskeletal: Positive for lower back pain Neurological: Moderate headache. Denies focal weakness or numbness. 10-point ROS otherwise negative.  ____________________________________________   PHYSICAL EXAM:  VITAL SIGNS: ED Triage Vitals [04/03/16 2025]  Enc Vitals Group     BP (!) 142/83     Pulse Rate 78     Resp 16     Temp 97.8 F (36.6 C)     Temp Source Oral     SpO2 100 %     Weight 295 lb (133.8 kg)     Height 5\' 11"  (1.803 m)     Head Circumference      Peak Flow      Pain Score 10     Pain Loc      Pain Edu?      Excl. in [place]?     Constitutional: Alert and oriented. Well appearing and in no distress. Eyes: Normal exam ENT   Head: Normocephalic and atraumatic   Mouth/Throat: Mucous membranes are moist. Cardiovascular: Normal rate, regular rhythm. No murmur Respiratory: Normal respiratory effort without tachypnea nor retractions. Breath sounds are clear Gastrointestinal: Soft and nontender. No distention.   Musculoskeletal: Nontender with normal range of motion in all extremities. 5/5 motor in all extremities including lower extremities. Sensation intact and equal. Patient does have significant tenderness to palpation of the right paraspinal lumbar spine. Neurologic:  Normal speech and language. No gross focal neurologic deficits  Skin:  Skin is warm, dry and intact.  Psychiatric: Mood and affect are normal.    ____________________________________________    EKG  EKG reviewed and interpreted by myself shows normal sinus rhythm at 75 bpm, narrow QRS, normal axis, normal intervals, no concerning ST changes noted.    INITIAL IMPRESSION / ASSESSMENT AND PLAN / ED COURSE  Pertinent labs & imaging results that were available during my care of the patient were reviewed by me and considered in my medical decision making (see chart for details).  The patient presents emergency department with worsening back pain since his injection yesterday. Overall the patient appears well, mild distress due to pain. Patient is likely experiencing acute on chronic lumbar pain secondary to inflammation after a recent injection. No urinary incontinence, no concerns for cauda equina. Patient has good strength in bilateral lower extremities with equal sensation. We will check basic labs, treat with pain and nausea medication and closely monitor in the emergency department.  Patient's labs show an elevated white blood cell count, patient did recently receive steroid injections lung with stress response  from his discomfort which could explain the elevation. Patient states he feels much better after the pain medication. Denies any headache, states the back pain is much improved. We will discharge her shortness for the pain medication have the patient follow-up with his orthopedist as soon as possible. Patient agreeable plan. ____________________________________________   FINAL CLINICAL IMPRESSION(S) / ED DIAGNOSES  Acute on chronic lumbar pain    Harvest Dark, MD 04/03/16 2208

## 2016-04-03 NOTE — Telephone Encounter (Signed)
States that he is sore this morning, but denies any needs. Instructed to call with any problems or concerns.

## 2016-04-07 ENCOUNTER — Telehealth: Payer: Self-pay

## 2016-04-07 NOTE — Telephone Encounter (Signed)
Called patient and he states that he is doing better.  Informed patient that he needed to give the injection 4-10 days.  Instructed patient to call back for any further questions or concerns.

## 2016-04-07 NOTE — Telephone Encounter (Signed)
Patient called and left vm. He had procedure last Thursday and is in a lot of pain. He wants to know if he can get something, like pain medicine or let him know what else he can do for the pain

## 2016-04-30 ENCOUNTER — Ambulatory Visit: Payer: BLUE CROSS/BLUE SHIELD | Admitting: Pain Medicine

## 2016-06-12 IMAGING — MR MR LUMBAR SPINE WO/W CM
4 of 7 series · 19 of 48 positions shown · IV contrast (multihance)
Comparison: CT abdomen and pelvis 05/30/2015

CLINICAL DATA: Midline low back pain for 1 month. Some radiation
across the flanks but no radiation into the groin, buttocks, or
legs.

EXAM:
MRI LUMBAR SPINE WITHOUT AND WITH CONTRAST
TECHNIQUE: Multiplanar and multiecho pulse sequences of the lumbar spine were
obtained without and with intravenous contrast.
CONTRAST:  20mL MULTIHANCE GADOBENATE DIMEGLUMINE 529 MG/ML IV SOLN

[Series 2: T2 · sagittal · 4.0mm · 0.55mm/px · 3 of 15 slices shown (1 of 2)]
[im 1/15]
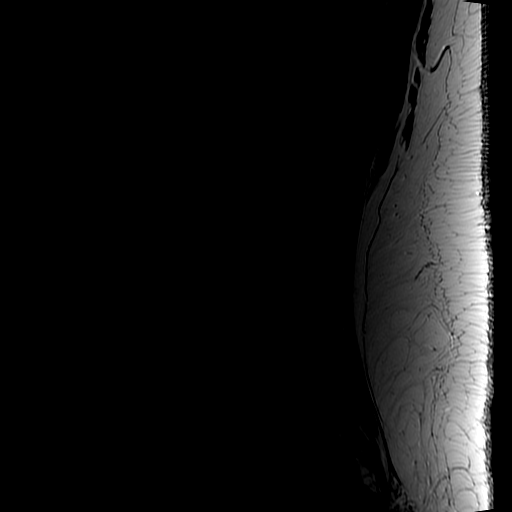
[im 8/15]
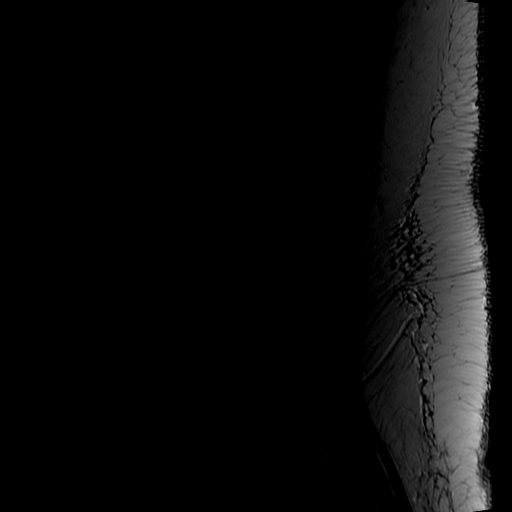
[im 15/15]
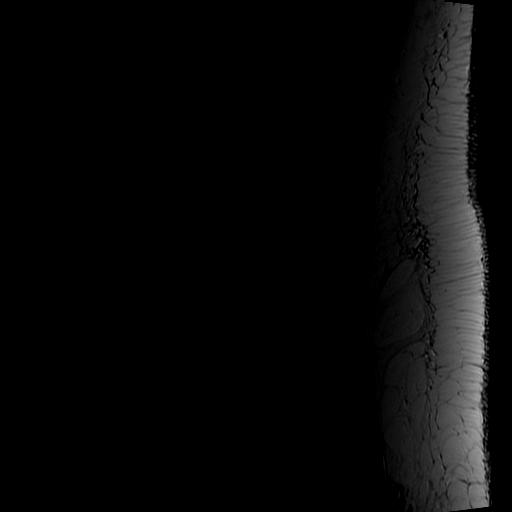

[Series 4: T1 · sagittal · 4.0mm · 0.55mm/px · 3 of 15 slices shown (1 of 2)]
[im 1/15]
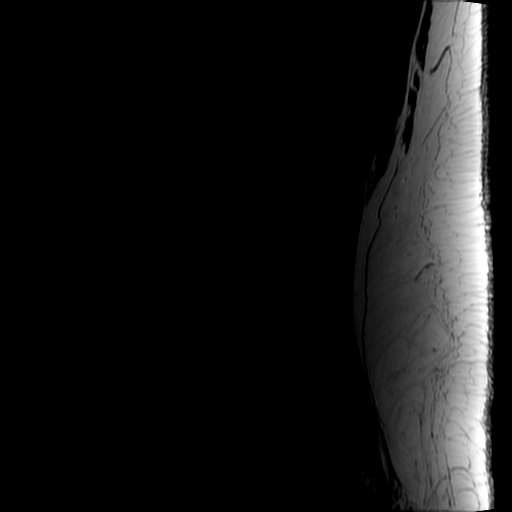
[im 10/15]
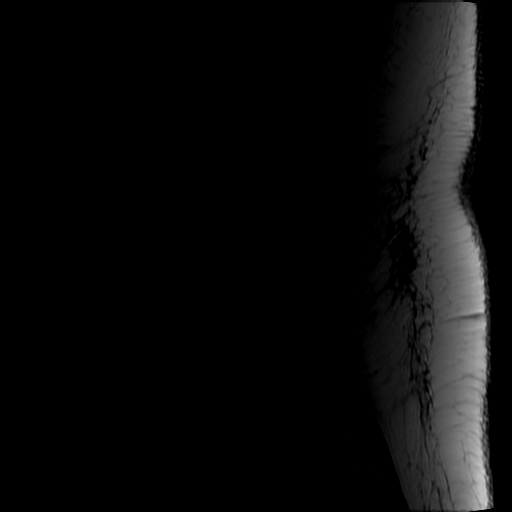
[im 15/15]
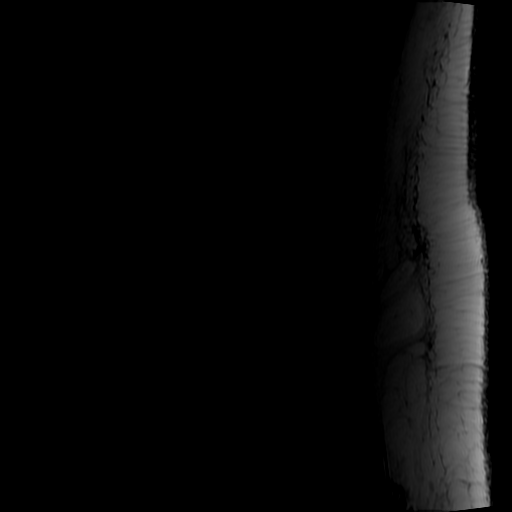

[Series 5: T1 · axial · 4.0mm · 0.39mm/px · z∈[-84,+85]mm · 3 of 40 slices shown (2 of 2)]
[im 4/40]
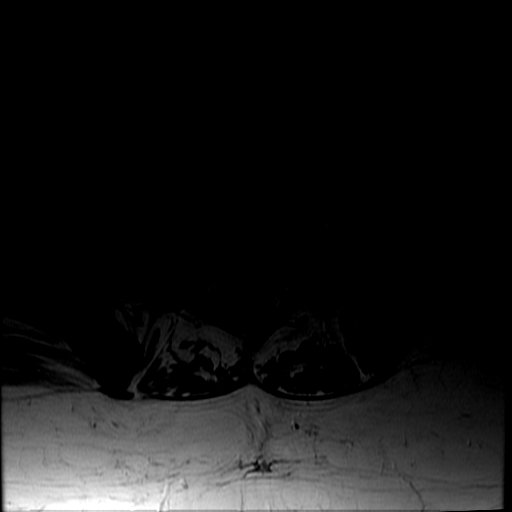
[im 20/40]
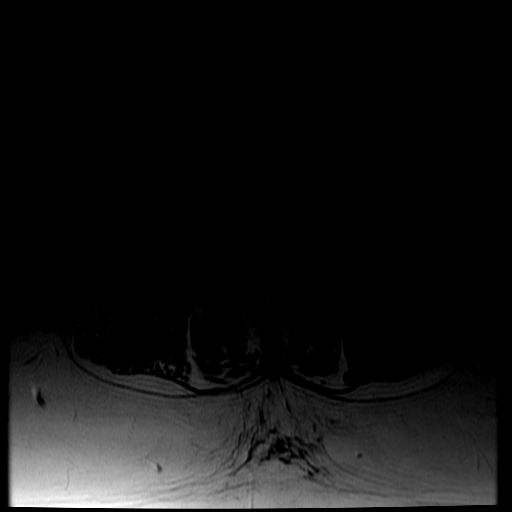
[im 36/40]
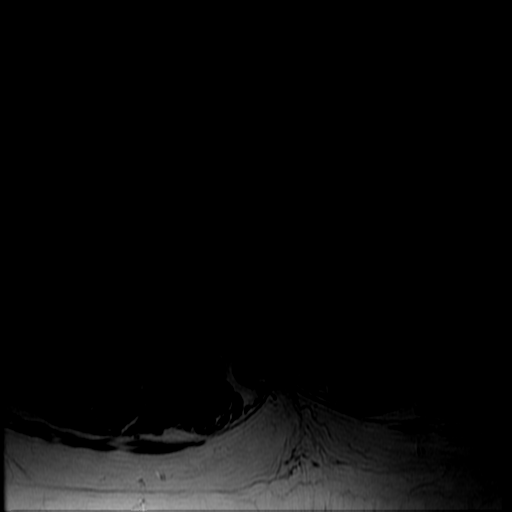

[Series 6: T2 · axial · 4.0mm · 0.39mm/px · z∈[-99,+86]mm · 10 of 40 slices shown (2 of 2)]
[im 1/40]
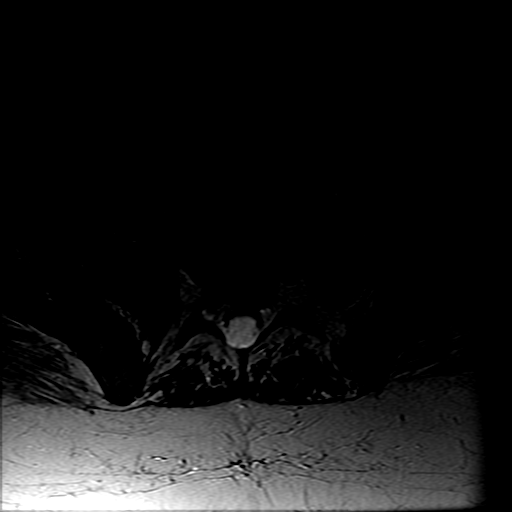
[im 4/40]
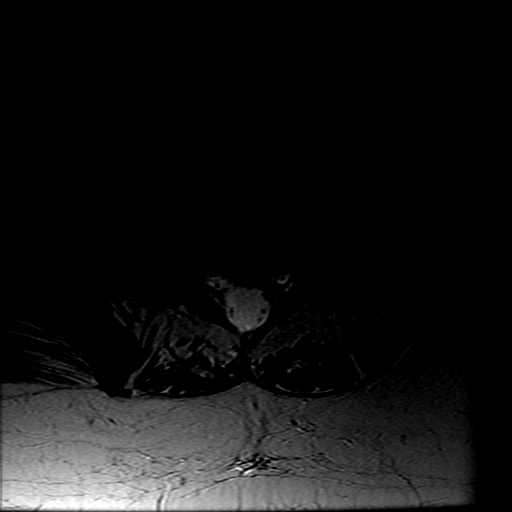
[im 8/40]
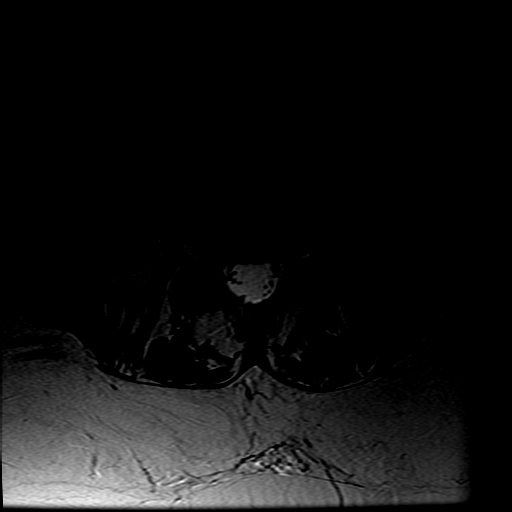
[im 12/40]
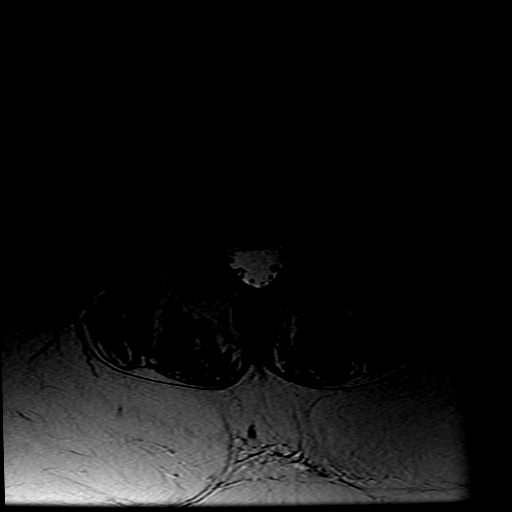
[im 16/40]
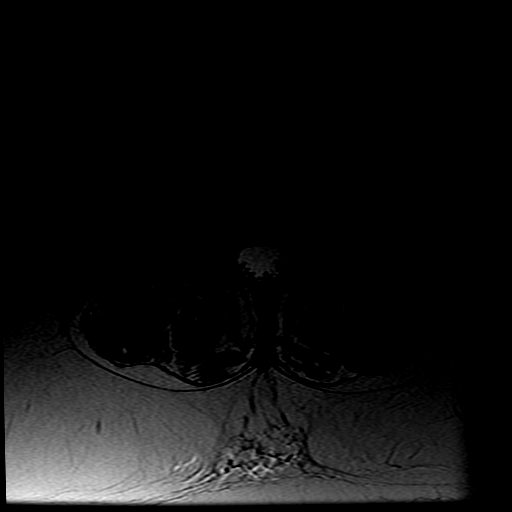
[im 20/40]
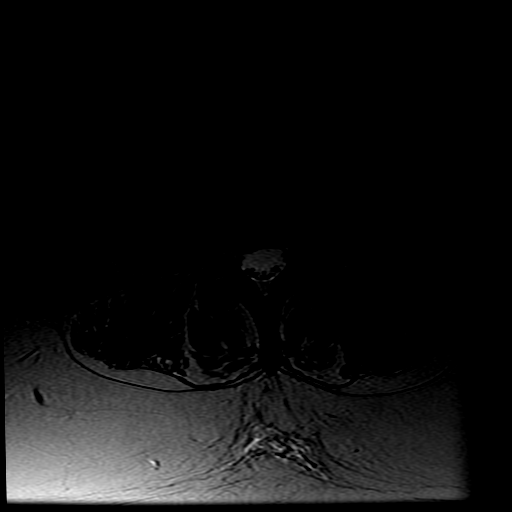
[im 24/40]
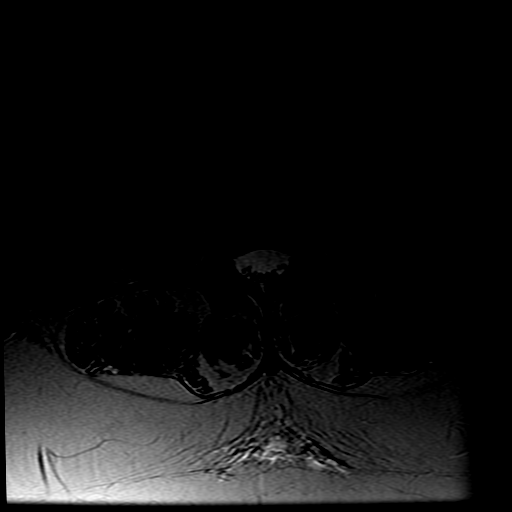
[im 28/40]
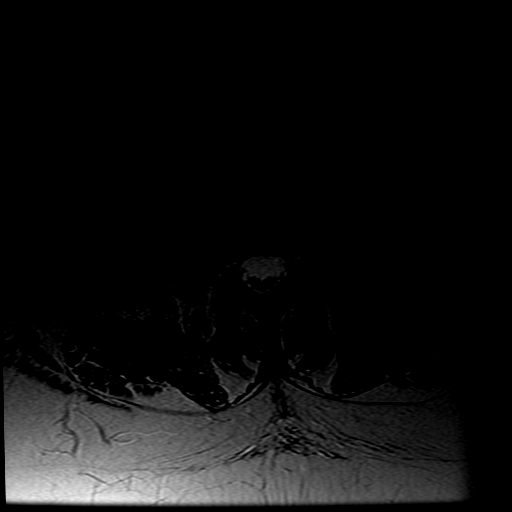
[im 32/40]
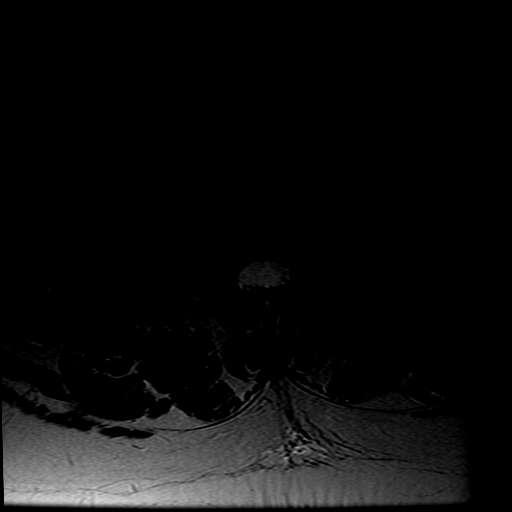
[im 36/40]
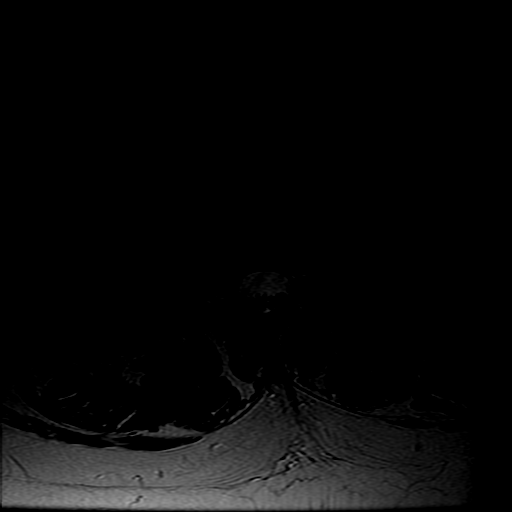

[19 of 48 positions shown; findings below may reference images not displayed]

FINDINGS: Image quality is degraded by patient body habitus. The lowest fully
formed intervertebral disc space is labeled L5-S1.

There is slight right convex curvature of the lumbar spine. There is
no listhesis. Vertebral body heights are preserved. At L3 vertebral
body hemangioma is again seen. No vertebral marrow edema is
identified. No suspicious enhancing lesion is seen. The conus
medullaris is normal in signal and terminates at L1. Paraspinal soft
tissues are unremarkable.

L1-2 through L3-4: No disc herniation or stenosis. At most minimal
facet arthrosis.

L4-5: Disc desiccation with minimal disc space height loss. Mild
circumferential disc bulging and minimal facet arthrosis result in
moderate bilateral neural foraminal stenosis without spinal
stenosis.

L5-S1:  Negative.
IMPRESSION: Single level disc degeneration at L4-5 with moderate bilateral
neural foraminal stenosis. No lumbar spinal stenosis.

## 2018-04-05 ENCOUNTER — Other Ambulatory Visit: Payer: Self-pay

## 2018-10-10 ENCOUNTER — Emergency Department
Admission: EM | Admit: 2018-10-10 | Discharge: 2018-10-10 | Discharge status: Absent without leave | Payer: BLUE CROSS/BLUE SHIELD
